# Patient Record
Sex: Female | Born: 1950
Health system: Southern US, Community
[De-identification: ages and names within clinical notes are randomized; demographics above are authoritative.]

## PROBLEM LIST (undated history)

## (undated) DIAGNOSIS — Z9221 Personal history of antineoplastic chemotherapy: Secondary | ICD-10-CM

## (undated) DIAGNOSIS — C50919 Malignant neoplasm of unspecified site of unspecified female breast: Secondary | ICD-10-CM

## (undated) DIAGNOSIS — Z9889 Other specified postprocedural states: Secondary | ICD-10-CM

## (undated) DIAGNOSIS — Z9289 Personal history of other medical treatment: Secondary | ICD-10-CM

## (undated) DIAGNOSIS — Z923 Personal history of irradiation: Secondary | ICD-10-CM

## (undated) DIAGNOSIS — I10 Essential (primary) hypertension: Secondary | ICD-10-CM

## (undated) DIAGNOSIS — E7800 Pure hypercholesterolemia, unspecified: Secondary | ICD-10-CM

## (undated) DIAGNOSIS — C50912 Malignant neoplasm of unspecified site of left female breast: Secondary | ICD-10-CM

## (undated) HISTORY — DX: Essential (primary) hypertension: I10

## (undated) HISTORY — PX: TUBAL LIGATION: SHX77

## (undated) HISTORY — DX: Other specified postprocedural states: Z98.890

## (undated) HISTORY — DX: Pure hypercholesterolemia, unspecified: E78.00

## (undated) HISTORY — DX: Personal history of other medical treatment: Z92.89

## (undated) HISTORY — DX: Malignant neoplasm of unspecified site of unspecified female breast: C50.919

## (undated) HISTORY — PX: PORTACATH PLACEMENT: SHX2246

## (undated) HISTORY — DX: Malignant neoplasm of unspecified site of left female breast: C50.912

---

## 2008-02-22 ENCOUNTER — Ambulatory Visit: Payer: Self-pay | Admitting: Obstetrics and Gynecology

## 2008-03-14 ENCOUNTER — Ambulatory Visit: Payer: Self-pay | Admitting: Gastroenterology

## 2009-03-06 ENCOUNTER — Ambulatory Visit: Payer: Self-pay | Admitting: Obstetrics and Gynecology

## 2010-03-12 ENCOUNTER — Ambulatory Visit: Payer: Self-pay | Admitting: Obstetrics and Gynecology

## 2010-07-21 DIAGNOSIS — Z9889 Other specified postprocedural states: Secondary | ICD-10-CM

## 2010-07-21 HISTORY — DX: Other specified postprocedural states: Z98.890

## 2011-04-07 ENCOUNTER — Ambulatory Visit: Payer: Self-pay | Admitting: Obstetrics and Gynecology

## 2011-04-10 ENCOUNTER — Ambulatory Visit: Payer: Self-pay | Admitting: Obstetrics and Gynecology

## 2011-05-14 ENCOUNTER — Ambulatory Visit: Payer: Self-pay | Admitting: Unknown Physician Specialty

## 2011-05-15 LAB — PATHOLOGY REPORT

## 2011-07-22 DIAGNOSIS — Z9289 Personal history of other medical treatment: Secondary | ICD-10-CM

## 2011-07-22 HISTORY — DX: Personal history of other medical treatment: Z92.89

## 2012-07-21 DIAGNOSIS — C50919 Malignant neoplasm of unspecified site of unspecified female breast: Secondary | ICD-10-CM

## 2012-07-21 HISTORY — DX: Malignant neoplasm of unspecified site of unspecified female breast: C50.919

## 2013-05-13 ENCOUNTER — Ambulatory Visit: Payer: Self-pay | Admitting: Internal Medicine

## 2013-05-26 ENCOUNTER — Ambulatory Visit: Payer: Self-pay | Admitting: Internal Medicine

## 2013-06-01 ENCOUNTER — Ambulatory Visit: Payer: Self-pay | Admitting: Internal Medicine

## 2013-06-01 HISTORY — PX: BREAST BIOPSY: SHX20

## 2013-06-08 LAB — PATHOLOGY REPORT

## 2013-06-14 ENCOUNTER — Ambulatory Visit: Payer: Self-pay | Admitting: Hematology and Oncology

## 2013-06-20 ENCOUNTER — Ambulatory Visit: Payer: Self-pay | Admitting: Surgery

## 2013-06-20 LAB — BASIC METABOLIC PANEL
Anion Gap: 5 — ABNORMAL LOW (ref 7–16)
BUN: 9 mg/dL (ref 7–18)
Calcium, Total: 9.7 mg/dL (ref 8.5–10.1)
Chloride: 92 mmol/L — ABNORMAL LOW (ref 98–107)
Co2: 32 mmol/L (ref 21–32)
Creatinine: 0.61 mg/dL (ref 0.60–1.30)
EGFR (African American): >60
EGFR (Non-African Amer.): >60
Glucose: 95 mg/dL (ref 65–99)
Osmolality: 257 (ref 275–301)
Sodium: 129 mmol/L — ABNORMAL LOW (ref 136–145)

## 2013-06-20 LAB — CBC WITH DIFFERENTIAL/PLATELET
Basophil #: 0.1 10*3/uL (ref 0.0–0.1)
Eosinophil #: 0.1 10*3/uL (ref 0.0–0.7)
Eosinophil %: 1.7 %
HCT: 38.7 % (ref 35.0–47.0)
Lymphocyte #: 1.5 10*3/uL (ref 1.0–3.6)
Lymphocyte %: 22.5 %
MCH: 31.1 pg (ref 26.0–34.0)
MCHC: 34.5 g/dL (ref 32.0–36.0)
MCV: 90 fL (ref 80–100)
Monocyte %: 8.5 %
Neutrophil #: 4.5 10*3/uL (ref 1.4–6.5)
RBC: 4.28 10*6/uL (ref 3.80–5.20)

## 2013-06-21 ENCOUNTER — Ambulatory Visit: Payer: Self-pay | Admitting: Surgery

## 2013-06-21 DIAGNOSIS — C50412 Malignant neoplasm of upper-outer quadrant of left female breast: Secondary | ICD-10-CM | POA: Insufficient documentation

## 2013-06-21 HISTORY — PX: BREAST LUMPECTOMY: SHX2

## 2013-07-01 ENCOUNTER — Ambulatory Visit: Payer: Self-pay

## 2013-07-21 ENCOUNTER — Ambulatory Visit: Payer: Self-pay

## 2013-07-27 ENCOUNTER — Ambulatory Visit: Payer: Self-pay | Admitting: Hematology and Oncology

## 2013-08-12 ENCOUNTER — Ambulatory Visit: Payer: Self-pay | Admitting: Surgery

## 2013-08-18 LAB — COMPREHENSIVE METABOLIC PANEL
Albumin: 4 g/dL (ref 3.4–5.0)
Alkaline Phosphatase: 116 U/L
Anion Gap: 6 — ABNORMAL LOW (ref 7–16)
BUN: 10 mg/dL (ref 7–18)
Bilirubin,Total: 0.3 mg/dL (ref 0.2–1.0)
Calcium, Total: 9.1 mg/dL (ref 8.5–10.1)
Chloride: 93 mmol/L — ABNORMAL LOW (ref 98–107)
Co2: 32 mmol/L (ref 21–32)
Creatinine: 0.71 mg/dL (ref 0.60–1.30)
EGFR (African American): >60
EGFR (Non-African Amer.): >60
Glucose: 104 mg/dL — ABNORMAL HIGH (ref 65–99)
Osmolality: 262 (ref 275–301)
Potassium: 3.6 mmol/L (ref 3.5–5.1)
SGOT(AST): 32 U/L (ref 15–37)
SGPT (ALT): 33 U/L (ref 12–78)
Sodium: 131 mmol/L — ABNORMAL LOW (ref 136–145)
Total Protein: 7.3 g/dL (ref 6.4–8.2)

## 2013-08-18 LAB — CBC CANCER CENTER
Basophil #: 0.1 x10 3/mm (ref 0.0–0.1)
Basophil %: 0.9 %
Eosinophil #: 0.1 x10 3/mm (ref 0.0–0.7)
Eosinophil %: 1.3 %
HCT: 40.5 % (ref 35.0–47.0)
HGB: 13.6 g/dL (ref 12.0–16.0)
Lymphocyte #: 1.4 x10 3/mm (ref 1.0–3.6)
Lymphocyte %: 22.6 %
MCH: 30.4 pg (ref 26.0–34.0)
MCHC: 33.6 g/dL (ref 32.0–36.0)
MCV: 90 fL (ref 80–100)
Monocyte #: 0.5 x10 3/mm (ref 0.2–0.9)
Monocyte %: 8.1 %
Neutrophil #: 4.2 x10 3/mm (ref 1.4–6.5)
Neutrophil %: 67.1 %
Platelet: 309 x10 3/mm (ref 150–440)
RBC: 4.48 10*6/uL (ref 3.80–5.20)
RDW: 12.9 % (ref 11.5–14.5)
WBC: 6.2 x10 3/mm (ref 3.6–11.0)

## 2013-08-21 ENCOUNTER — Ambulatory Visit: Payer: Self-pay | Admitting: Hematology and Oncology

## 2013-08-21 ENCOUNTER — Ambulatory Visit: Payer: Self-pay

## 2013-08-25 LAB — CBC CANCER CENTER
BASOS ABS: 0 x10 3/mm (ref 0.0–0.1)
BASOS PCT: 4.3 %
Eosinophil #: 0 x10 3/mm (ref 0.0–0.7)
Eosinophil %: 3.3 %
HCT: 38 % (ref 35.0–47.0)
HGB: 12.6 g/dL (ref 12.0–16.0)
LYMPHS PCT: 70.9 %
Lymphocyte #: 0.5 x10 3/mm — ABNORMAL LOW (ref 1.0–3.6)
MCH: 30 pg (ref 26.0–34.0)
MCHC: 33.1 g/dL (ref 32.0–36.0)
MCV: 91 fL (ref 80–100)
MONO ABS: 0.1 x10 3/mm — AB (ref 0.2–0.9)
MONOS PCT: 9.8 %
NEUTROS PCT: 11.7 %
Neutrophil #: 0.1 x10 3/mm — ABNORMAL LOW (ref 1.4–6.5)
PLATELETS: 109 x10 3/mm — AB (ref 150–440)
RBC: 4.19 10*6/uL (ref 3.80–5.20)
RDW: 12.3 % (ref 11.5–14.5)
WBC: 0.7 x10 3/mm — CL (ref 3.6–11.0)

## 2013-09-01 LAB — CBC CANCER CENTER
Basophil #: 0.1 x10 3/mm (ref 0.0–0.1)
Basophil %: 0.4 %
Eosinophil #: 0 x10 3/mm (ref 0.0–0.7)
Eosinophil %: 0 %
HCT: 36.7 % (ref 35.0–47.0)
HGB: 12.2 g/dL (ref 12.0–16.0)
LYMPHS ABS: 1.5 x10 3/mm (ref 1.0–3.6)
Lymphocyte %: 9.7 %
MCH: 29.8 pg (ref 26.0–34.0)
MCHC: 33.1 g/dL (ref 32.0–36.0)
MCV: 90 fL (ref 80–100)
MONOS PCT: 6.9 %
Monocyte #: 1.1 x10 3/mm — ABNORMAL HIGH (ref 0.2–0.9)
NEUTROS ABS: 12.6 x10 3/mm — AB (ref 1.4–6.5)
Neutrophil %: 83 %
PLATELETS: 293 x10 3/mm (ref 150–440)
RBC: 4.08 10*6/uL (ref 3.80–5.20)
RDW: 12.6 % (ref 11.5–14.5)
WBC: 15.3 x10 3/mm — AB (ref 3.6–11.0)

## 2013-09-08 LAB — CBC CANCER CENTER
Basophil #: 0.1 x10 3/mm (ref 0.0–0.1)
Basophil %: 1.2 %
EOS PCT: 0.1 %
Eosinophil #: 0 x10 3/mm (ref 0.0–0.7)
HCT: 36.6 % (ref 35.0–47.0)
HGB: 12.2 g/dL (ref 12.0–16.0)
Lymphocyte #: 1.3 x10 3/mm (ref 1.0–3.6)
Lymphocyte %: 14.4 %
MCH: 30.2 pg (ref 26.0–34.0)
MCHC: 33.2 g/dL (ref 32.0–36.0)
MCV: 91 fL (ref 80–100)
Monocyte #: 1.1 x10 3/mm — ABNORMAL HIGH (ref 0.2–0.9)
Monocyte %: 12.9 %
NEUTROS ABS: 6.3 x10 3/mm (ref 1.4–6.5)
Neutrophil %: 71.4 %
Platelet: 507 x10 3/mm — ABNORMAL HIGH (ref 150–440)
RBC: 4.03 10*6/uL (ref 3.80–5.20)
RDW: 12.9 % (ref 11.5–14.5)
WBC: 8.9 x10 3/mm (ref 3.6–11.0)

## 2013-09-08 LAB — BASIC METABOLIC PANEL
ANION GAP: 8 (ref 7–16)
BUN: 10 mg/dL (ref 7–18)
CREATININE: 0.67 mg/dL (ref 0.60–1.30)
Calcium, Total: 8.3 mg/dL — ABNORMAL LOW (ref 8.5–10.1)
Chloride: 96 mmol/L — ABNORMAL LOW (ref 98–107)
Co2: 30 mmol/L (ref 21–32)
EGFR (Non-African Amer.): >60
Glucose: 112 mg/dL — ABNORMAL HIGH (ref 65–99)
Osmolality: 268 (ref 275–301)
Potassium: 3.9 mmol/L (ref 3.5–5.1)
Sodium: 134 mmol/L — ABNORMAL LOW (ref 136–145)

## 2013-09-14 LAB — CBC CANCER CENTER
BASOS ABS: 0 x10 3/mm (ref 0.0–0.1)
BASOS PCT: 0.4 %
EOS PCT: 0.7 %
Eosinophil #: 0 x10 3/mm (ref 0.0–0.7)
HCT: 30.9 % — ABNORMAL LOW (ref 35.0–47.0)
HGB: 10.3 g/dL — AB (ref 12.0–16.0)
LYMPHS ABS: 0.4 x10 3/mm — AB (ref 1.0–3.6)
Lymphocyte %: 20.1 %
MCH: 30.2 pg (ref 26.0–34.0)
MCHC: 33.1 g/dL (ref 32.0–36.0)
MCV: 91 fL (ref 80–100)
MONO ABS: 0 x10 3/mm — AB (ref 0.2–0.9)
MONOS PCT: 1.6 %
NEUTROS PCT: 77.2 %
Neutrophil #: 1.5 x10 3/mm (ref 1.4–6.5)
Platelet: 176 x10 3/mm (ref 150–440)
RBC: 3.39 10*6/uL — AB (ref 3.80–5.20)
RDW: 13.1 % (ref 11.5–14.5)
WBC: 1.9 x10 3/mm — CL (ref 3.6–11.0)

## 2013-09-18 ENCOUNTER — Ambulatory Visit: Payer: Self-pay | Admitting: Hematology and Oncology

## 2013-09-18 ENCOUNTER — Ambulatory Visit: Payer: Self-pay

## 2013-09-22 LAB — CBC CANCER CENTER
BASOS PCT: 0.3 %
Basophil #: 0 x10 3/mm (ref 0.0–0.1)
EOS PCT: 0.2 %
Eosinophil #: 0 x10 3/mm (ref 0.0–0.7)
HCT: 31.6 % — ABNORMAL LOW (ref 35.0–47.0)
HGB: 10.5 g/dL — ABNORMAL LOW (ref 12.0–16.0)
LYMPHS ABS: 1.2 x10 3/mm (ref 1.0–3.6)
Lymphocyte %: 6.9 %
MCH: 29.9 pg (ref 26.0–34.0)
MCHC: 33.3 g/dL (ref 32.0–36.0)
MCV: 90 fL (ref 80–100)
Monocyte #: 1.3 x10 3/mm — ABNORMAL HIGH (ref 0.2–0.9)
Monocyte %: 7.7 %
NEUTROS ABS: 14.2 x10 3/mm — AB (ref 1.4–6.5)
NEUTROS PCT: 84.9 %
Platelet: 246 x10 3/mm (ref 150–440)
RBC: 3.52 10*6/uL — AB (ref 3.80–5.20)
RDW: 12.9 % (ref 11.5–14.5)
WBC: 16.7 x10 3/mm — AB (ref 3.6–11.0)

## 2013-09-29 LAB — BASIC METABOLIC PANEL
Anion Gap: 7 (ref 7–16)
BUN: 10 mg/dL (ref 7–18)
CO2: 31 mmol/L (ref 21–32)
CREATININE: 0.65 mg/dL (ref 0.60–1.30)
Calcium, Total: 9.1 mg/dL (ref 8.5–10.1)
Chloride: 97 mmol/L — ABNORMAL LOW (ref 98–107)
EGFR (African American): >60
EGFR (Non-African Amer.): >60
Glucose: 83 mg/dL (ref 65–99)
OSMOLALITY: 268 (ref 275–301)
Potassium: 4.1 mmol/L (ref 3.5–5.1)
Sodium: 135 mmol/L — ABNORMAL LOW (ref 136–145)

## 2013-09-29 LAB — CBC CANCER CENTER
Basophil #: 0.2 x10 3/mm — ABNORMAL HIGH (ref 0.0–0.1)
Basophil %: 2.6 %
EOS ABS: 0 x10 3/mm (ref 0.0–0.7)
EOS PCT: 0.1 %
HCT: 33.2 % — ABNORMAL LOW (ref 35.0–47.0)
HGB: 11.2 g/dL — ABNORMAL LOW (ref 12.0–16.0)
Lymphocyte #: 0.7 x10 3/mm — ABNORMAL LOW (ref 1.0–3.6)
Lymphocyte %: 10.6 %
MCH: 30.6 pg (ref 26.0–34.0)
MCHC: 33.7 g/dL (ref 32.0–36.0)
MCV: 91 fL (ref 80–100)
MONO ABS: 1 x10 3/mm — AB (ref 0.2–0.9)
MONOS PCT: 13.7 %
NEUTROS PCT: 73 %
Neutrophil #: 5.2 x10 3/mm (ref 1.4–6.5)
Platelet: 450 x10 3/mm — ABNORMAL HIGH (ref 150–440)
RBC: 3.66 10*6/uL — ABNORMAL LOW (ref 3.80–5.20)
RDW: 13.8 % (ref 11.5–14.5)
WBC: 7.1 x10 3/mm (ref 3.6–11.0)

## 2013-10-06 LAB — CBC CANCER CENTER
Basophil #: 0 x10 3/mm (ref 0.0–0.1)
Basophil %: 0.4 %
Eosinophil #: 0 x10 3/mm (ref 0.0–0.7)
Eosinophil %: 0.9 %
HCT: 27.7 % — ABNORMAL LOW (ref 35.0–47.0)
HGB: 9.3 g/dL — ABNORMAL LOW (ref 12.0–16.0)
Lymphocyte #: 0.4 x10 3/mm — ABNORMAL LOW (ref 1.0–3.6)
Lymphocyte %: 32.5 %
MCH: 30.6 pg (ref 26.0–34.0)
MCHC: 33.5 g/dL (ref 32.0–36.0)
MCV: 91 fL (ref 80–100)
Monocyte #: 0.1 x10 3/mm — ABNORMAL LOW (ref 0.2–0.9)
Monocyte %: 5.7 %
NEUTROS PCT: 60.5 %
Neutrophil #: 0.8 x10 3/mm — ABNORMAL LOW (ref 1.4–6.5)
Platelet: 112 x10 3/mm — ABNORMAL LOW (ref 150–440)
RBC: 3.04 10*6/uL — ABNORMAL LOW (ref 3.80–5.20)
RDW: 14.4 % (ref 11.5–14.5)
WBC: 1.3 x10 3/mm — AB (ref 3.6–11.0)

## 2013-10-13 LAB — CBC CANCER CENTER
BASOS ABS: 0.1 x10 3/mm (ref 0.0–0.1)
Basophil %: 0.4 %
EOS ABS: 0 x10 3/mm (ref 0.0–0.7)
EOS PCT: 0.1 %
HCT: 31.9 % — ABNORMAL LOW (ref 35.0–47.0)
HGB: 10.5 g/dL — ABNORMAL LOW (ref 12.0–16.0)
LYMPHS ABS: 1.1 x10 3/mm (ref 1.0–3.6)
Lymphocyte %: 7.9 %
MCH: 30.3 pg (ref 26.0–34.0)
MCHC: 33 g/dL (ref 32.0–36.0)
MCV: 92 fL (ref 80–100)
MONO ABS: 1.3 x10 3/mm — AB (ref 0.2–0.9)
Monocyte %: 9 %
Neutrophil #: 11.8 x10 3/mm — ABNORMAL HIGH (ref 1.4–6.5)
Neutrophil %: 82.6 %
Platelet: 290 x10 3/mm (ref 150–440)
RBC: 3.48 10*6/uL — AB (ref 3.80–5.20)
RDW: 14.8 % — AB (ref 11.5–14.5)
WBC: 14.2 x10 3/mm — ABNORMAL HIGH (ref 3.6–11.0)

## 2013-10-19 ENCOUNTER — Ambulatory Visit: Payer: Self-pay

## 2013-10-19 ENCOUNTER — Ambulatory Visit: Payer: Self-pay | Admitting: Hematology and Oncology

## 2013-10-20 LAB — BASIC METABOLIC PANEL
Anion Gap: 7 (ref 7–16)
BUN: 9 mg/dL (ref 7–18)
CO2: 30 mmol/L (ref 21–32)
Calcium, Total: 9 mg/dL (ref 8.5–10.1)
Chloride: 97 mmol/L — ABNORMAL LOW (ref 98–107)
Creatinine: 0.61 mg/dL (ref 0.60–1.30)
EGFR (African American): >60
EGFR (Non-African Amer.): >60
Glucose: 95 mg/dL (ref 65–99)
Osmolality: 267 (ref 275–301)
Potassium: 3.9 mmol/L (ref 3.5–5.1)
SODIUM: 134 mmol/L — AB (ref 136–145)

## 2013-10-20 LAB — CBC CANCER CENTER
Basophil #: 0.1 x10 3/mm (ref 0.0–0.1)
Basophil %: 1.2 %
Eosinophil #: 0 x10 3/mm (ref 0.0–0.7)
Eosinophil %: 0.1 %
HCT: 32.6 % — ABNORMAL LOW (ref 35.0–47.0)
HGB: 11.1 g/dL — ABNORMAL LOW (ref 12.0–16.0)
LYMPHS PCT: 10.4 %
Lymphocyte #: 0.7 x10 3/mm — ABNORMAL LOW (ref 1.0–3.6)
MCH: 31.4 pg (ref 26.0–34.0)
MCHC: 33.9 g/dL (ref 32.0–36.0)
MCV: 93 fL (ref 80–100)
Monocyte #: 1.2 x10 3/mm — ABNORMAL HIGH (ref 0.2–0.9)
Monocyte %: 17.4 %
Neutrophil #: 4.8 x10 3/mm (ref 1.4–6.5)
Neutrophil %: 70.9 %
PLATELETS: 410 x10 3/mm (ref 150–440)
RBC: 3.52 10*6/uL — AB (ref 3.80–5.20)
RDW: 17.1 % — AB (ref 11.5–14.5)
WBC: 6.8 x10 3/mm (ref 3.6–11.0)

## 2013-10-27 LAB — CBC CANCER CENTER
BASOS ABS: 0 x10 3/mm (ref 0.0–0.1)
Basophil %: 3.3 %
EOS PCT: 0.9 %
Eosinophil #: 0 x10 3/mm (ref 0.0–0.7)
HCT: 27.5 % — AB (ref 35.0–47.0)
HGB: 9.3 g/dL — AB (ref 12.0–16.0)
LYMPHS ABS: 0.4 x10 3/mm — AB (ref 1.0–3.6)
LYMPHS PCT: 42.7 %
MCH: 31.6 pg (ref 26.0–34.0)
MCHC: 33.7 g/dL (ref 32.0–36.0)
MCV: 94 fL (ref 80–100)
MONO ABS: 0.1 x10 3/mm — AB (ref 0.2–0.9)
Monocyte %: 6.8 %
Neutrophil #: 0.4 x10 3/mm — ABNORMAL LOW (ref 1.4–6.5)
Neutrophil %: 46.3 %
Platelet: 85 x10 3/mm — ABNORMAL LOW (ref 150–440)
RBC: 2.94 10*6/uL — ABNORMAL LOW (ref 3.80–5.20)
RDW: 16.3 % — AB (ref 11.5–14.5)
WBC: 0.8 x10 3/mm — CL (ref 3.6–11.0)

## 2013-11-03 LAB — CBC CANCER CENTER
BASOS ABS: 0 x10 3/mm (ref 0.0–0.1)
Basophil %: 0.2 %
EOS ABS: 0 x10 3/mm (ref 0.0–0.7)
Eosinophil %: 0 %
HCT: 31.3 % — AB (ref 35.0–47.0)
HGB: 10.3 g/dL — ABNORMAL LOW (ref 12.0–16.0)
Lymphocyte #: 0.9 x10 3/mm — ABNORMAL LOW (ref 1.0–3.6)
Lymphocyte %: 6 %
MCH: 30.8 pg (ref 26.0–34.0)
MCHC: 32.7 g/dL (ref 32.0–36.0)
MCV: 94 fL (ref 80–100)
Monocyte #: 1.4 x10 3/mm — ABNORMAL HIGH (ref 0.2–0.9)
Monocyte %: 10 %
Neutrophil #: 11.9 x10 3/mm — ABNORMAL HIGH (ref 1.4–6.5)
Neutrophil %: 83.8 %
PLATELETS: 255 x10 3/mm (ref 150–440)
RBC: 3.33 10*6/uL — AB (ref 3.80–5.20)
RDW: 16.2 % — ABNORMAL HIGH (ref 11.5–14.5)
WBC: 14.3 x10 3/mm — ABNORMAL HIGH (ref 3.6–11.0)

## 2013-11-10 LAB — CBC CANCER CENTER
BASOS ABS: 0.1 x10 3/mm (ref 0.0–0.1)
Basophil %: 1.2 %
Eosinophil #: 0 x10 3/mm (ref 0.0–0.7)
Eosinophil %: 0.1 %
HCT: 31.5 % — AB (ref 35.0–47.0)
HGB: 10.6 g/dL — AB (ref 12.0–16.0)
Lymphocyte #: 0.6 x10 3/mm — ABNORMAL LOW (ref 1.0–3.6)
Lymphocyte %: 9.4 %
MCH: 31.9 pg (ref 26.0–34.0)
MCHC: 33.8 g/dL (ref 32.0–36.0)
MCV: 95 fL (ref 80–100)
MONO ABS: 1.1 x10 3/mm — AB (ref 0.2–0.9)
Monocyte %: 18.1 %
Neutrophil #: 4.4 x10 3/mm (ref 1.4–6.5)
Neutrophil %: 71.2 %
Platelet: 460 x10 3/mm — ABNORMAL HIGH (ref 150–440)
RBC: 3.33 10*6/uL — ABNORMAL LOW (ref 3.80–5.20)
RDW: 17.9 % — AB (ref 11.5–14.5)
WBC: 6.2 x10 3/mm (ref 3.6–11.0)

## 2013-11-10 LAB — BASIC METABOLIC PANEL
ANION GAP: 9 (ref 7–16)
BUN: 10 mg/dL (ref 7–18)
CALCIUM: 9.2 mg/dL (ref 8.5–10.1)
Chloride: 97 mmol/L — ABNORMAL LOW (ref 98–107)
Co2: 29 mmol/L (ref 21–32)
Creatinine: 0.69 mg/dL (ref 0.60–1.30)
EGFR (African American): >60
EGFR (Non-African Amer.): >60
GLUCOSE: 99 mg/dL (ref 65–99)
Osmolality: 269 (ref 275–301)
POTASSIUM: 3.9 mmol/L (ref 3.5–5.1)
Sodium: 135 mmol/L — ABNORMAL LOW (ref 136–145)

## 2013-11-17 LAB — CBC CANCER CENTER
Basophil #: 0.1 x10 3/mm (ref 0.0–0.1)
Basophil %: 1.5 %
EOS ABS: 0 x10 3/mm (ref 0.0–0.7)
Eosinophil %: 0.3 %
HCT: 29.4 % — ABNORMAL LOW (ref 35.0–47.0)
HGB: 10.3 g/dL — AB (ref 12.0–16.0)
LYMPHS ABS: 0.7 x10 3/mm — AB (ref 1.0–3.6)
Lymphocyte %: 13.8 %
MCH: 32.7 pg (ref 26.0–34.0)
MCHC: 35.2 g/dL (ref 32.0–36.0)
MCV: 93 fL (ref 80–100)
MONO ABS: 0.5 x10 3/mm (ref 0.2–0.9)
Monocyte %: 10.8 %
Neutrophil #: 3.6 x10 3/mm (ref 1.4–6.5)
Neutrophil %: 73.6 %
Platelet: 264 x10 3/mm (ref 150–440)
RBC: 3.17 10*6/uL — AB (ref 3.80–5.20)
RDW: 17.4 % — AB (ref 11.5–14.5)
WBC: 4.9 x10 3/mm (ref 3.6–11.0)

## 2013-11-18 ENCOUNTER — Ambulatory Visit: Payer: Self-pay | Admitting: Hematology and Oncology

## 2013-11-24 LAB — COMPREHENSIVE METABOLIC PANEL
Albumin: 3.8 g/dL (ref 3.4–5.0)
Alkaline Phosphatase: 126 U/L — ABNORMAL HIGH
Anion Gap: 8 (ref 7–16)
BUN: 8 mg/dL (ref 7–18)
Bilirubin,Total: 0.5 mg/dL (ref 0.2–1.0)
Calcium, Total: 9.1 mg/dL (ref 8.5–10.1)
Chloride: 95 mmol/L — ABNORMAL LOW (ref 98–107)
Co2: 31 mmol/L (ref 21–32)
Creatinine: 0.56 mg/dL — ABNORMAL LOW (ref 0.60–1.30)
EGFR (African American): >60
EGFR (Non-African Amer.): >60
Glucose: 110 mg/dL — ABNORMAL HIGH (ref 65–99)
Osmolality: 267 (ref 275–301)
Potassium: 3.7 mmol/L (ref 3.5–5.1)
SGOT(AST): 31 U/L (ref 15–37)
SGPT (ALT): 50 U/L (ref 12–78)
Sodium: 134 mmol/L — ABNORMAL LOW (ref 136–145)
Total Protein: 6.7 g/dL (ref 6.4–8.2)

## 2013-11-24 LAB — CBC CANCER CENTER
Basophil #: 0 x10 3/mm (ref 0.0–0.1)
Basophil %: 0.4 %
Eosinophil #: 0.1 x10 3/mm (ref 0.0–0.7)
Eosinophil %: 1.7 %
HCT: 29.3 % — ABNORMAL LOW (ref 35.0–47.0)
HGB: 10.1 g/dL — ABNORMAL LOW (ref 12.0–16.0)
Lymphocyte #: 0.6 x10 3/mm — ABNORMAL LOW (ref 1.0–3.6)
Lymphocyte %: 13.4 %
MCH: 33.1 pg (ref 26.0–34.0)
MCHC: 34.4 g/dL (ref 32.0–36.0)
MCV: 96 fL (ref 80–100)
Monocyte #: 0.4 x10 3/mm (ref 0.2–0.9)
Monocyte %: 9.3 %
Neutrophil #: 3.3 x10 3/mm (ref 1.4–6.5)
Neutrophil %: 75.2 %
Platelet: 230 x10 3/mm (ref 150–440)
RBC: 3.05 10*6/uL — ABNORMAL LOW (ref 3.80–5.20)
RDW: 17.5 % — ABNORMAL HIGH (ref 11.5–14.5)
WBC: 4.3 x10 3/mm (ref 3.6–11.0)

## 2013-12-01 LAB — CBC CANCER CENTER
Basophil #: 0.1 x10 3/mm (ref 0.0–0.1)
Basophil %: 1.7 %
Eosinophil #: 0.1 x10 3/mm (ref 0.0–0.7)
Eosinophil %: 2.7 %
HCT: 28.8 % — ABNORMAL LOW (ref 35.0–47.0)
HGB: 10.1 g/dL — ABNORMAL LOW (ref 12.0–16.0)
Lymphocyte #: 0.6 x10 3/mm — ABNORMAL LOW (ref 1.0–3.6)
Lymphocyte %: 13.5 %
MCH: 33.5 pg (ref 26.0–34.0)
MCHC: 35.2 g/dL (ref 32.0–36.0)
MCV: 95 fL (ref 80–100)
Monocyte #: 0.4 x10 3/mm (ref 0.2–0.9)
Monocyte %: 9.5 %
Neutrophil #: 3 x10 3/mm (ref 1.4–6.5)
Neutrophil %: 72.6 %
Platelet: 265 x10 3/mm (ref 150–440)
RBC: 3.02 10*6/uL — ABNORMAL LOW (ref 3.80–5.20)
RDW: 17 % — ABNORMAL HIGH (ref 11.5–14.5)
WBC: 4.2 x10 3/mm (ref 3.6–11.0)

## 2013-12-08 LAB — BASIC METABOLIC PANEL
Anion Gap: 9 (ref 7–16)
BUN: 7 mg/dL (ref 7–18)
Calcium, Total: 8.3 mg/dL — ABNORMAL LOW (ref 8.5–10.1)
Chloride: 97 mmol/L — ABNORMAL LOW (ref 98–107)
Co2: 29 mmol/L (ref 21–32)
Creatinine: 0.55 mg/dL — ABNORMAL LOW (ref 0.60–1.30)
EGFR (African American): >60
EGFR (Non-African Amer.): >60
Glucose: 105 mg/dL — ABNORMAL HIGH (ref 65–99)
Osmolality: 268 (ref 275–301)
Potassium: 3.7 mmol/L (ref 3.5–5.1)
Sodium: 135 mmol/L — ABNORMAL LOW (ref 136–145)

## 2013-12-08 LAB — CBC CANCER CENTER
Basophil #: 0.1 x10 3/mm (ref 0.0–0.1)
Basophil %: 2.3 %
Eosinophil #: 0 x10 3/mm (ref 0.0–0.7)
Eosinophil %: 1.3 %
HCT: 30.5 % — ABNORMAL LOW (ref 35.0–47.0)
HGB: 10.7 g/dL — ABNORMAL LOW (ref 12.0–16.0)
Lymphocyte #: 0.5 x10 3/mm — ABNORMAL LOW (ref 1.0–3.6)
Lymphocyte %: 15.1 %
MCH: 33.5 pg (ref 26.0–34.0)
MCHC: 34.9 g/dL (ref 32.0–36.0)
MCV: 96 fL (ref 80–100)
Monocyte #: 0.3 x10 3/mm (ref 0.2–0.9)
Monocyte %: 8 %
Neutrophil #: 2.4 x10 3/mm (ref 1.4–6.5)
Neutrophil %: 73.3 %
Platelet: 246 x10 3/mm (ref 150–440)
RBC: 3.18 10*6/uL — ABNORMAL LOW (ref 3.80–5.20)
RDW: 16 % — ABNORMAL HIGH (ref 11.5–14.5)
WBC: 3.3 x10 3/mm — ABNORMAL LOW (ref 3.6–11.0)

## 2013-12-15 LAB — URINALYSIS, COMPLETE
Bilirubin,UR: NEGATIVE
Glucose,UR: NEGATIVE mg/dL (ref 0–75)
Ketone: NEGATIVE
Nitrite: NEGATIVE
Ph: 6 (ref 4.5–8.0)
Protein: NEGATIVE
RBC,UR: 1 /HPF (ref 0–5)
Specific Gravity: 1.003 (ref 1.003–1.030)
Squamous Epithelial: 2
WBC UR: 19 /HPF (ref 0–5)

## 2013-12-15 LAB — CBC CANCER CENTER
Basophil #: 0 x10 3/mm (ref 0.0–0.1)
Basophil %: 0.9 %
Eosinophil #: 0 x10 3/mm (ref 0.0–0.7)
Eosinophil %: 0.9 %
HCT: 31.4 % — ABNORMAL LOW (ref 35.0–47.0)
HGB: 10.9 g/dL — ABNORMAL LOW (ref 12.0–16.0)
Lymphocyte #: 0.5 x10 3/mm — ABNORMAL LOW (ref 1.0–3.6)
Lymphocyte %: 10.8 %
MCH: 34.2 pg — ABNORMAL HIGH (ref 26.0–34.0)
MCHC: 34.5 g/dL (ref 32.0–36.0)
MCV: 99 fL (ref 80–100)
Monocyte #: 0.5 x10 3/mm (ref 0.2–0.9)
Monocyte %: 9.4 %
Neutrophil #: 3.8 x10 3/mm (ref 1.4–6.5)
Neutrophil %: 78 %
Platelet: 256 x10 3/mm (ref 150–440)
RBC: 3.18 10*6/uL — ABNORMAL LOW (ref 3.80–5.20)
RDW: 14.9 % — ABNORMAL HIGH (ref 11.5–14.5)
WBC: 4.9 x10 3/mm (ref 3.6–11.0)

## 2013-12-16 LAB — URINE CULTURE

## 2013-12-19 ENCOUNTER — Ambulatory Visit: Payer: Self-pay | Admitting: Hematology and Oncology

## 2013-12-22 LAB — CBC CANCER CENTER
Basophil #: 0.1 x10 3/mm (ref 0.0–0.1)
Basophil %: 1 %
Eosinophil #: 0 x10 3/mm (ref 0.0–0.7)
Eosinophil %: 0.8 %
HCT: 32.3 % — ABNORMAL LOW (ref 35.0–47.0)
HGB: 11.1 g/dL — ABNORMAL LOW (ref 12.0–16.0)
Lymphocyte #: 0.8 x10 3/mm — ABNORMAL LOW (ref 1.0–3.6)
Lymphocyte %: 15 %
MCH: 34 pg (ref 26.0–34.0)
MCHC: 34.5 g/dL (ref 32.0–36.0)
MCV: 99 fL (ref 80–100)
Monocyte #: 0.5 x10 3/mm (ref 0.2–0.9)
Monocyte %: 9.7 %
Neutrophil #: 4 x10 3/mm (ref 1.4–6.5)
Neutrophil %: 73.5 %
Platelet: 281 x10 3/mm (ref 150–440)
RBC: 3.27 10*6/uL — ABNORMAL LOW (ref 3.80–5.20)
RDW: 14.9 % — ABNORMAL HIGH (ref 11.5–14.5)
WBC: 5.4 x10 3/mm (ref 3.6–11.0)

## 2013-12-29 LAB — CBC CANCER CENTER
Basophil #: 0 x10 3/mm (ref 0.0–0.1)
Basophil %: 1.2 %
Eosinophil #: 0 x10 3/mm (ref 0.0–0.7)
Eosinophil %: 1.2 %
HCT: 32.2 % — ABNORMAL LOW (ref 35.0–47.0)
HGB: 11.2 g/dL — ABNORMAL LOW (ref 12.0–16.0)
Lymphocyte #: 0.6 x10 3/mm — ABNORMAL LOW (ref 1.0–3.6)
Lymphocyte %: 16.8 %
MCH: 33.7 pg (ref 26.0–34.0)
MCHC: 34.7 g/dL (ref 32.0–36.0)
MCV: 97 fL (ref 80–100)
Monocyte #: 0.4 x10 3/mm (ref 0.2–0.9)
Monocyte %: 10.4 %
Neutrophil #: 2.4 x10 3/mm (ref 1.4–6.5)
Neutrophil %: 70.4 %
Platelet: 264 x10 3/mm (ref 150–440)
RBC: 3.32 10*6/uL — ABNORMAL LOW (ref 3.80–5.20)
RDW: 14.5 % (ref 11.5–14.5)
WBC: 3.4 x10 3/mm — ABNORMAL LOW (ref 3.6–11.0)

## 2013-12-29 LAB — BASIC METABOLIC PANEL
Anion Gap: 7 (ref 7–16)
BUN: 12 mg/dL (ref 7–18)
Calcium, Total: 9.4 mg/dL (ref 8.5–10.1)
Chloride: 94 mmol/L — ABNORMAL LOW (ref 98–107)
Co2: 31 mmol/L (ref 21–32)
Creatinine: 0.58 mg/dL — ABNORMAL LOW (ref 0.60–1.30)
EGFR (African American): >60
EGFR (Non-African Amer.): >60
Glucose: 111 mg/dL — ABNORMAL HIGH (ref 65–99)
Osmolality: 265 (ref 275–301)
Potassium: 3.6 mmol/L (ref 3.5–5.1)
Sodium: 132 mmol/L — ABNORMAL LOW (ref 136–145)

## 2014-01-05 LAB — CBC CANCER CENTER
Basophil #: 0 x10 3/mm (ref 0.0–0.1)
Basophil %: 0.8 %
Eosinophil #: 0 x10 3/mm (ref 0.0–0.7)
Eosinophil %: 1 %
HCT: 33.5 % — ABNORMAL LOW (ref 35.0–47.0)
HGB: 11.5 g/dL — ABNORMAL LOW (ref 12.0–16.0)
Lymphocyte #: 0.8 x10 3/mm — ABNORMAL LOW (ref 1.0–3.6)
Lymphocyte %: 21.1 %
MCH: 33.2 pg (ref 26.0–34.0)
MCHC: 34.4 g/dL (ref 32.0–36.0)
MCV: 97 fL (ref 80–100)
Monocyte #: 0.4 x10 3/mm (ref 0.2–0.9)
Monocyte %: 10.9 %
Neutrophil #: 2.4 x10 3/mm (ref 1.4–6.5)
Neutrophil %: 66.2 %
Platelet: 290 x10 3/mm (ref 150–440)
RBC: 3.46 10*6/uL — ABNORMAL LOW (ref 3.80–5.20)
RDW: 14.4 % (ref 11.5–14.5)
WBC: 3.6 x10 3/mm (ref 3.6–11.0)

## 2014-01-12 LAB — CBC CANCER CENTER
Basophil #: 0 x10 3/mm (ref 0.0–0.1)
Basophil %: 0.1 %
Eosinophil #: 0 x10 3/mm (ref 0.0–0.7)
Eosinophil %: 0 %
HCT: 33.9 % — ABNORMAL LOW (ref 35.0–47.0)
HGB: 11.7 g/dL — ABNORMAL LOW (ref 12.0–16.0)
Lymphocyte #: 0.4 x10 3/mm — ABNORMAL LOW (ref 1.0–3.6)
Lymphocyte %: 4.9 %
MCH: 33 pg (ref 26.0–34.0)
MCHC: 34.4 g/dL (ref 32.0–36.0)
MCV: 96 fL (ref 80–100)
Monocyte #: 0.3 x10 3/mm (ref 0.2–0.9)
Monocyte %: 3.7 %
Neutrophil #: 6.9 x10 3/mm — ABNORMAL HIGH (ref 1.4–6.5)
Neutrophil %: 91.3 %
Platelet: 295 x10 3/mm (ref 150–440)
RBC: 3.54 10*6/uL — ABNORMAL LOW (ref 3.80–5.20)
RDW: 14.4 % (ref 11.5–14.5)
WBC: 7.5 x10 3/mm (ref 3.6–11.0)

## 2014-01-18 ENCOUNTER — Ambulatory Visit: Payer: Self-pay | Admitting: Hematology and Oncology

## 2014-01-18 ENCOUNTER — Ambulatory Visit: Payer: Self-pay

## 2014-01-19 LAB — CBC CANCER CENTER
Basophil #: 0 x10 3/mm (ref 0.0–0.1)
Basophil %: 0.7 %
Eosinophil #: 0 x10 3/mm (ref 0.0–0.7)
Eosinophil %: 0.8 %
HCT: 33.9 % — ABNORMAL LOW (ref 35.0–47.0)
HGB: 11.6 g/dL — ABNORMAL LOW (ref 12.0–16.0)
Lymphocyte #: 1 x10 3/mm (ref 1.0–3.6)
Lymphocyte %: 20.9 %
MCH: 33 pg (ref 26.0–34.0)
MCHC: 34.3 g/dL (ref 32.0–36.0)
MCV: 96 fL (ref 80–100)
Monocyte #: 0.5 x10 3/mm (ref 0.2–0.9)
Monocyte %: 12 %
Neutrophil #: 3 x10 3/mm (ref 1.4–6.5)
Neutrophil %: 65.6 %
Platelet: 278 x10 3/mm (ref 150–440)
RBC: 3.52 10*6/uL — ABNORMAL LOW (ref 3.80–5.20)
RDW: 14.4 % (ref 11.5–14.5)
WBC: 4.6 x10 3/mm (ref 3.6–11.0)

## 2014-01-19 LAB — BASIC METABOLIC PANEL
Anion Gap: 6 — ABNORMAL LOW (ref 7–16)
BUN: 11 mg/dL (ref 7–18)
Calcium, Total: 8.8 mg/dL (ref 8.5–10.1)
Chloride: 95 mmol/L — ABNORMAL LOW (ref 98–107)
Co2: 32 mmol/L (ref 21–32)
Creatinine: 0.54 mg/dL — ABNORMAL LOW (ref 0.60–1.30)
EGFR (African American): >60
EGFR (Non-African Amer.): >60
Glucose: 95 mg/dL (ref 65–99)
Osmolality: 266 (ref 275–301)
Potassium: 3.6 mmol/L (ref 3.5–5.1)
Sodium: 133 mmol/L — ABNORMAL LOW (ref 136–145)

## 2014-01-26 LAB — CBC CANCER CENTER
Basophil #: 0 x10 3/mm (ref 0.0–0.1)
Basophil %: 1 %
Eosinophil #: 0 x10 3/mm (ref 0.0–0.7)
Eosinophil %: 1.1 %
HCT: 32.6 % — ABNORMAL LOW (ref 35.0–47.0)
HGB: 11.1 g/dL — ABNORMAL LOW (ref 12.0–16.0)
Lymphocyte #: 0.7 x10 3/mm — ABNORMAL LOW (ref 1.0–3.6)
Lymphocyte %: 21.2 %
MCH: 33 pg (ref 26.0–34.0)
MCHC: 34 g/dL (ref 32.0–36.0)
MCV: 97 fL (ref 80–100)
Monocyte #: 0.4 x10 3/mm (ref 0.2–0.9)
Monocyte %: 12.5 %
Neutrophil #: 2.2 x10 3/mm (ref 1.4–6.5)
Neutrophil %: 64.2 %
Platelet: 263 x10 3/mm (ref 150–440)
RBC: 3.36 10*6/uL — ABNORMAL LOW (ref 3.80–5.20)
RDW: 14.4 % (ref 11.5–14.5)
WBC: 3.5 x10 3/mm — ABNORMAL LOW (ref 3.6–11.0)

## 2014-02-18 ENCOUNTER — Ambulatory Visit: Payer: Self-pay | Admitting: Oncology

## 2014-02-18 ENCOUNTER — Ambulatory Visit: Payer: Self-pay | Admitting: Hematology and Oncology

## 2014-02-23 LAB — CBC CANCER CENTER
Basophil #: 0.1 x10 3/mm (ref 0.0–0.1)
Basophil %: 1.2 %
EOS PCT: 1.8 %
Eosinophil #: 0.1 x10 3/mm (ref 0.0–0.7)
HCT: 35.7 % (ref 35.0–47.0)
HGB: 12 g/dL (ref 12.0–16.0)
LYMPHS PCT: 17.4 %
Lymphocyte #: 1 x10 3/mm (ref 1.0–3.6)
MCH: 32.2 pg (ref 26.0–34.0)
MCHC: 33.5 g/dL (ref 32.0–36.0)
MCV: 96 fL (ref 80–100)
MONO ABS: 0.6 x10 3/mm (ref 0.2–0.9)
MONOS PCT: 9.9 %
Neutrophil #: 4.2 x10 3/mm (ref 1.4–6.5)
Neutrophil %: 69.7 %
Platelet: 272 x10 3/mm (ref 150–440)
RBC: 3.72 10*6/uL — ABNORMAL LOW (ref 3.80–5.20)
RDW: 14.1 % (ref 11.5–14.5)
WBC: 6 x10 3/mm (ref 3.6–11.0)

## 2014-02-23 LAB — COMPREHENSIVE METABOLIC PANEL
ALBUMIN: 3.7 g/dL (ref 3.4–5.0)
ALK PHOS: 105 U/L
ALT: 43 U/L
ANION GAP: 4 — AB (ref 7–16)
BUN: 11 mg/dL (ref 7–18)
Bilirubin,Total: 0.4 mg/dL (ref 0.2–1.0)
CALCIUM: 9.3 mg/dL (ref 8.5–10.1)
Chloride: 94 mmol/L — ABNORMAL LOW (ref 98–107)
Co2: 33 mmol/L — ABNORMAL HIGH (ref 21–32)
Creatinine: 0.69 mg/dL (ref 0.60–1.30)
EGFR (African American): >60
GLUCOSE: 123 mg/dL — AB (ref 65–99)
OSMOLALITY: 263 (ref 275–301)
POTASSIUM: 3.6 mmol/L (ref 3.5–5.1)
SGOT(AST): 34 U/L (ref 15–37)
SODIUM: 131 mmol/L — AB (ref 136–145)
Total Protein: 6.5 g/dL (ref 6.4–8.2)

## 2014-03-13 LAB — CBC CANCER CENTER
Basophil #: 0.1 x10 3/mm (ref 0.0–0.1)
Basophil %: 1 %
Eosinophil #: 0.1 x10 3/mm (ref 0.0–0.7)
Eosinophil %: 1.2 %
HCT: 37.2 % (ref 35.0–47.0)
HGB: 12.3 g/dL (ref 12.0–16.0)
LYMPHS ABS: 0.7 x10 3/mm — AB (ref 1.0–3.6)
Lymphocyte %: 13.3 %
MCH: 31.5 pg (ref 26.0–34.0)
MCHC: 33.2 g/dL (ref 32.0–36.0)
MCV: 95 fL (ref 80–100)
MONO ABS: 0.6 x10 3/mm (ref 0.2–0.9)
Monocyte %: 11.2 %
Neutrophil #: 3.9 x10 3/mm (ref 1.4–6.5)
Neutrophil %: 73.3 %
PLATELETS: 266 x10 3/mm (ref 150–440)
RBC: 3.92 10*6/uL (ref 3.80–5.20)
RDW: 13.7 % (ref 11.5–14.5)
WBC: 5.3 x10 3/mm (ref 3.6–11.0)

## 2014-03-20 LAB — CBC CANCER CENTER
BASOS ABS: 0.1 x10 3/mm (ref 0.0–0.1)
Basophil %: 1.2 %
Eosinophil #: 0.1 x10 3/mm (ref 0.0–0.7)
Eosinophil %: 1.5 %
HCT: 36.5 % (ref 35.0–47.0)
HGB: 12.1 g/dL (ref 12.0–16.0)
Lymphocyte #: 0.7 x10 3/mm — ABNORMAL LOW (ref 1.0–3.6)
Lymphocyte %: 14.4 %
MCH: 31.5 pg (ref 26.0–34.0)
MCHC: 33.3 g/dL (ref 32.0–36.0)
MCV: 95 fL (ref 80–100)
Monocyte #: 0.6 x10 3/mm (ref 0.2–0.9)
Monocyte %: 12.6 %
Neutrophil #: 3.3 x10 3/mm (ref 1.4–6.5)
Neutrophil %: 70.3 %
PLATELETS: 242 x10 3/mm (ref 150–440)
RBC: 3.85 10*6/uL (ref 3.80–5.20)
RDW: 13.7 % (ref 11.5–14.5)
WBC: 4.6 x10 3/mm (ref 3.6–11.0)

## 2014-03-21 ENCOUNTER — Ambulatory Visit: Payer: Self-pay | Admitting: Hematology and Oncology

## 2014-03-21 ENCOUNTER — Ambulatory Visit: Payer: Self-pay | Admitting: Oncology

## 2014-04-03 LAB — CBC CANCER CENTER
Basophil #: 0 x10 3/mm (ref 0.0–0.1)
Basophil %: 0.6 %
EOS ABS: 0.1 x10 3/mm (ref 0.0–0.7)
EOS PCT: 0.8 %
HCT: 40.6 % (ref 35.0–47.0)
HGB: 13.6 g/dL (ref 12.0–16.0)
Lymphocyte #: 0.4 x10 3/mm — ABNORMAL LOW (ref 1.0–3.6)
Lymphocyte %: 5.6 %
MCH: 31.5 pg (ref 26.0–34.0)
MCHC: 33.6 g/dL (ref 32.0–36.0)
MCV: 94 fL (ref 80–100)
Monocyte #: 0.8 x10 3/mm (ref 0.2–0.9)
Monocyte %: 10.5 %
NEUTROS ABS: 6 x10 3/mm (ref 1.4–6.5)
NEUTROS PCT: 82.5 %
Platelet: 246 x10 3/mm (ref 150–440)
RBC: 4.33 10*6/uL (ref 3.80–5.20)
RDW: 13.3 % (ref 11.5–14.5)
WBC: 7.3 x10 3/mm (ref 3.6–11.0)

## 2014-04-09 DIAGNOSIS — I10 Essential (primary) hypertension: Secondary | ICD-10-CM | POA: Insufficient documentation

## 2014-04-09 DIAGNOSIS — E785 Hyperlipidemia, unspecified: Secondary | ICD-10-CM | POA: Insufficient documentation

## 2014-04-09 DIAGNOSIS — M159 Polyosteoarthritis, unspecified: Secondary | ICD-10-CM | POA: Insufficient documentation

## 2014-04-10 LAB — CBC CANCER CENTER
BASOS ABS: 0.1 x10 3/mm (ref 0.0–0.1)
BASOS PCT: 1.1 %
EOS PCT: 1 %
Eosinophil #: 0 x10 3/mm (ref 0.0–0.7)
HCT: 37.6 % (ref 35.0–47.0)
HGB: 12.7 g/dL (ref 12.0–16.0)
Lymphocyte #: 0.8 x10 3/mm — ABNORMAL LOW (ref 1.0–3.6)
Lymphocyte %: 15.3 %
MCH: 31.6 pg (ref 26.0–34.0)
MCHC: 33.8 g/dL (ref 32.0–36.0)
MCV: 94 fL (ref 80–100)
Monocyte #: 0.6 x10 3/mm (ref 0.2–0.9)
Monocyte %: 12.2 %
Neutrophil #: 3.6 x10 3/mm (ref 1.4–6.5)
Neutrophil %: 70.4 %
Platelet: 240 x10 3/mm (ref 150–440)
RBC: 4.02 10*6/uL (ref 3.80–5.20)
RDW: 13.5 % (ref 11.5–14.5)
WBC: 5.2 x10 3/mm (ref 3.6–11.0)

## 2014-04-17 LAB — CBC CANCER CENTER
BASOS ABS: 0 x10 3/mm (ref 0.0–0.1)
Basophil %: 0.8 %
EOS ABS: 0.1 x10 3/mm (ref 0.0–0.7)
EOS PCT: 0.9 %
HCT: 39.5 % (ref 35.0–47.0)
HGB: 13.1 g/dL (ref 12.0–16.0)
Lymphocyte #: 0.6 x10 3/mm — ABNORMAL LOW (ref 1.0–3.6)
Lymphocyte %: 10.7 %
MCH: 31 pg (ref 26.0–34.0)
MCHC: 33.2 g/dL (ref 32.0–36.0)
MCV: 93 fL (ref 80–100)
MONO ABS: 0.8 x10 3/mm (ref 0.2–0.9)
Monocyte %: 13.4 %
NEUTROS ABS: 4.4 x10 3/mm (ref 1.4–6.5)
NEUTROS PCT: 74.2 %
PLATELETS: 271 x10 3/mm (ref 150–440)
RBC: 4.23 10*6/uL (ref 3.80–5.20)
RDW: 13.2 % (ref 11.5–14.5)
WBC: 5.9 x10 3/mm (ref 3.6–11.0)

## 2014-04-20 ENCOUNTER — Ambulatory Visit: Payer: Self-pay | Admitting: Hematology and Oncology

## 2014-04-24 LAB — CBC CANCER CENTER
Basophil #: 0 x10 3/mm (ref 0.0–0.1)
Basophil %: 0.7 %
Eosinophil #: 0.1 x10 3/mm (ref 0.0–0.7)
Eosinophil %: 1 %
HCT: 39.8 % (ref 35.0–47.0)
HGB: 13.3 g/dL (ref 12.0–16.0)
Lymphocyte #: 0.6 x10 3/mm — ABNORMAL LOW (ref 1.0–3.6)
Lymphocyte %: 11.4 %
MCH: 30.9 pg (ref 26.0–34.0)
MCHC: 33.4 g/dL (ref 32.0–36.0)
MCV: 93 fL (ref 80–100)
Monocyte #: 0.7 x10 3/mm (ref 0.2–0.9)
Monocyte %: 12.3 %
Neutrophil #: 4.1 x10 3/mm (ref 1.4–6.5)
Neutrophil %: 74.6 %
Platelet: 256 x10 3/mm (ref 150–440)
RBC: 4.3 10*6/uL (ref 3.80–5.20)
RDW: 13.1 % (ref 11.5–14.5)
WBC: 5.4 x10 3/mm (ref 3.6–11.0)

## 2014-04-26 ENCOUNTER — Ambulatory Visit: Payer: Self-pay | Admitting: Internal Medicine

## 2014-04-26 DIAGNOSIS — Z853 Personal history of malignant neoplasm of breast: Secondary | ICD-10-CM | POA: Insufficient documentation

## 2014-04-26 DIAGNOSIS — C50419 Malignant neoplasm of upper-outer quadrant of unspecified female breast: Secondary | ICD-10-CM | POA: Insufficient documentation

## 2014-04-26 DIAGNOSIS — G622 Polyneuropathy due to other toxic agents: Secondary | ICD-10-CM | POA: Insufficient documentation

## 2014-04-26 DIAGNOSIS — G6289 Other specified polyneuropathies: Secondary | ICD-10-CM

## 2014-05-18 LAB — CBC CANCER CENTER
Basophil #: 0.1 x10 3/mm (ref 0.0–0.1)
Basophil %: 1.1 %
Eosinophil #: 0 x10 3/mm (ref 0.0–0.7)
Eosinophil %: 0.9 %
HCT: 39.1 % (ref 35.0–47.0)
HGB: 13 g/dL (ref 12.0–16.0)
Lymphocyte #: 0.9 x10 3/mm — ABNORMAL LOW (ref 1.0–3.6)
Lymphocyte %: 17.9 %
MCH: 30.5 pg (ref 26.0–34.0)
MCHC: 33.3 g/dL (ref 32.0–36.0)
MCV: 92 fL (ref 80–100)
Monocyte #: 0.6 x10 3/mm (ref 0.2–0.9)
Monocyte %: 12.1 %
Neutrophil #: 3.3 x10 3/mm (ref 1.4–6.5)
Neutrophil %: 68 %
Platelet: 263 x10 3/mm (ref 150–440)
RBC: 4.27 10*6/uL (ref 3.80–5.20)
RDW: 13.3 % (ref 11.5–14.5)
WBC: 4.9 x10 3/mm (ref 3.6–11.0)

## 2014-05-18 LAB — COMPREHENSIVE METABOLIC PANEL
Albumin: 4 g/dL (ref 3.4–5.0)
Alkaline Phosphatase: 122 U/L — ABNORMAL HIGH
Anion Gap: 8 (ref 7–16)
BUN: 9 mg/dL (ref 7–18)
Bilirubin,Total: 0.4 mg/dL (ref 0.2–1.0)
Calcium, Total: 9.3 mg/dL (ref 8.5–10.1)
Chloride: 90 mmol/L — ABNORMAL LOW (ref 98–107)
Co2: 31 mmol/L (ref 21–32)
Creatinine: 0.61 mg/dL (ref 0.60–1.30)
EGFR (African American): >60
EGFR (Non-African Amer.): >60
Glucose: 89 mg/dL (ref 65–99)
Osmolality: 257 (ref 275–301)
Potassium: 3.8 mmol/L (ref 3.5–5.1)
SGOT(AST): 30 U/L (ref 15–37)
SGPT (ALT): 36 U/L
Sodium: 129 mmol/L — ABNORMAL LOW (ref 136–145)
Total Protein: 6.9 g/dL (ref 6.4–8.2)

## 2014-05-21 ENCOUNTER — Ambulatory Visit: Payer: Self-pay | Admitting: Hematology and Oncology

## 2014-05-21 ENCOUNTER — Ambulatory Visit: Payer: Self-pay | Admitting: Internal Medicine

## 2014-06-14 LAB — COMPREHENSIVE METABOLIC PANEL
Albumin: 3.8 g/dL (ref 3.4–5.0)
Alkaline Phosphatase: 84 U/L
Anion Gap: 4 — ABNORMAL LOW (ref 7–16)
BUN: 9 mg/dL (ref 7–18)
Bilirubin,Total: 0.4 mg/dL (ref 0.2–1.0)
Calcium, Total: 9.7 mg/dL (ref 8.5–10.1)
Chloride: 96 mmol/L — ABNORMAL LOW (ref 98–107)
Co2: 34 mmol/L — ABNORMAL HIGH (ref 21–32)
Creatinine: 0.66 mg/dL (ref 0.60–1.30)
EGFR (African American): >60
EGFR (Non-African Amer.): >60
Glucose: 90 mg/dL (ref 65–99)
Osmolality: 266 (ref 275–301)
Potassium: 3.9 mmol/L (ref 3.5–5.1)
SGOT(AST): 31 U/L (ref 15–37)
SGPT (ALT): 38 U/L
Sodium: 134 mmol/L — ABNORMAL LOW (ref 136–145)
TOTAL PROTEIN: 6.5 g/dL (ref 6.4–8.2)

## 2014-06-14 LAB — MAGNESIUM: Magnesium: 2 mg/dL

## 2014-06-20 ENCOUNTER — Ambulatory Visit: Payer: Self-pay | Admitting: Hematology and Oncology

## 2014-06-20 DIAGNOSIS — Z9289 Personal history of other medical treatment: Secondary | ICD-10-CM

## 2014-06-20 HISTORY — DX: Personal history of other medical treatment: Z92.89

## 2014-07-05 ENCOUNTER — Ambulatory Visit: Payer: Self-pay | Admitting: Hematology and Oncology

## 2014-07-05 DIAGNOSIS — M810 Age-related osteoporosis without current pathological fracture: Secondary | ICD-10-CM | POA: Insufficient documentation

## 2014-07-05 DIAGNOSIS — M818 Other osteoporosis without current pathological fracture: Secondary | ICD-10-CM

## 2014-07-26 ENCOUNTER — Ambulatory Visit: Payer: Self-pay | Admitting: Hematology and Oncology

## 2014-08-21 ENCOUNTER — Ambulatory Visit: Payer: Self-pay | Admitting: Hematology and Oncology

## 2014-09-19 ENCOUNTER — Ambulatory Visit
Admit: 2014-09-19 | Disposition: A | Discharge status: Home / Self Care | Payer: Self-pay | Attending: Hematology and Oncology | Admitting: Hematology and Oncology

## 2014-10-30 ENCOUNTER — Ambulatory Visit
Admit: 2014-10-30 | Disposition: A | Discharge status: Home / Self Care | Payer: Self-pay | Attending: Hematology and Oncology | Admitting: Hematology and Oncology

## 2014-11-10 NOTE — Op Note (Signed)
PATIENT NAME:  Raven, HARVIE MR#:  [id number] DATE OF BIRTH:  07/27/1950  DATE OF PROCEDURE:  06/21/2013  PREOPERATIVE DIAGNOSIS: Left breast cancer.   POSTOPERATIVE DIAGNOSIS:  Left breast cancer.   PROCEDURE PERFORMED: Left breast needle localization excisional biopsy and left axillary sentinel lymph node biopsy.   SURGEON: Sherri Rad, M.D. FACS  ASSISTANT: None.   ANESTHESIA: General with LMA.  OPERATIVE FINDINGS: 1.  Radioactive and blue sentinel lymph node. Frozen section demonstrated no evidence of micrometastases. An additional radioactive and non-blue lymph node was submitted. No palpable lymphadenopathy was noted in the axilla. Background count was 55.  Ex vivo count of 10 seconds lymph node as described above was 1675.   ESTIMATED BLOOD LOSS: Minimal.   DRAINS: None.   DESCRIPTION OF PROCEDURE: After appropriate localization and sentinel lymph node injection in radiology, the patient was brought to the operating room and positioned supine. General anesthesia was induced. A total of 1 mL of Lymphazurin blue was infiltrated around the periareolar subdermal injection. The area was massaged for 5 minutes. The patient's left breast was sterilely prepped and draped with ChloraPrep solution. Wire was amputated near the skin site. Timeout was observed.   An axillary curvilinear incision at the inferior area of the hair bearing  region on the left side was fashioned and carried through clavipectoral fascia with electrocautery. Neoprobe interrogation demonstrated an area of increased uptake. Gentle dissection in this area demonstrated a blue lymphatic channel which was followed to a lymph node, which was excised with electrocautery and submitted to pathology as described above. In the process of doing this, a non-blue nonradioactive lymph node was identified and excised. Palpation of the axilla demonstrated no evidence of bulky lymphadenopathy. There was no other blue channels. Intra-operative  frozen section was negative.  The clavipectoral fascia was reapproximated utilizing interrupted 3-0 Vicryl sutures. 10 mL of 0.25% plain Marcaine was infiltrated into the area. A 4-0 running subcuticular stitch was then placed followed by benzoin, Steri-Strips, and occlusive sterile dressing.   Attention was then turned to the lumpectomy procedure at which point the transversely oriented breast incision was fashioned in the left mid breast encompassing the wire entrance site, approximately 6 cm lateral to the nipple. Subcutaneous tissues were divided with electrocautery and subdermal flaps were raised circumferentially. Wire site was identified. Circumferential excisional biopsy with a greater than 1 cm margin was obtained with electrocautery. Hemostasis was obtained with point cautery. The specimen was oriented with margin markers and submitted for mammographic analysis. The previously placed biopsied marker was demonstrated in the center of the specimen. Pathology demonstrated this to have greater than 1 cm margins by gross analysis.   The remaining local anesthesia was infiltrated in the biopsy cavity. The biopsy cavity was marked at a space with multiple 5 mm titanium hemoclips. The wound was then reapproximated in the deep layer with interrupted 3-0 Vicryl. Interrupted inverted 3-0 Vicryl were then placed. Running 4-0 Vicryl subcuticular was applied to the skin. Benzoin, Steri-Strips, and occlusive sterile dressing was placed. The patient was then subsequently extubated and taken to the recovery room in stable and satisfactory condition by anesthesia services.   ____________________________ Jeannette Barber Marina Gravel, MD FACS mab:dp D: 06/21/2013 15:27:33 ET T: 06/21/2013 16:31:41 ET JOB#: [id number]  cc: Elta Guadeloupe A. Marina Gravel, MD, <Dictator> Rae Halsted. Kallie Edward, MD Hortencia Conradi MD ELECTRONICALLY SIGNED 06/24/2013 7:07

## 2014-11-11 NOTE — Op Note (Signed)
PATIENT NAME:  Raven Barber, Raven Barber MR#:  [id number] DATE OF BIRTH:  September 07, 1950  DATE OF PROCEDURE:  08/12/2013  PREOPERATIVE DIAGNOSIS: Stage II breast cancer, left side.   POSTOPERATIVE DIAGNOSIS: Stage II breast cancer, left side.   PROCEDURE PERFORMED: Right internal jugular vein Port-A-Cath insertion.   SURGEON: Sherri Rad, MD   ASSISTANT: None.   ANESTHESIA: Local with heavy intravenous sedation and monitored anesthesia care.   DESCRIPTION OF PROCEDURE: With informed consent, in supine position, percutaneous monitoring lines were placed. Intravenous sedation was administered. The patient's right neck and chest were sterilely prepped and draped with ChloraPrep solution and Ioban. A shoulder roll was placed.   Timeout was observed.   Utilizing handheld sonography sterilely draped, the course of the internal jugular vein on the right side was identified with identification of compressibility. Utilizing a 22-gauge finder needle and the patient in Trendelenburg position, the needle was placed into the vein on first pass. A larger bore catheter needle was placed. Wire was passed. Some ectopy was noted. Local anesthesia was infiltrated on the anterior chest wall on the right side. A subcutaneous pocket was fashioned with electrocautery down to the clavipectoral fascia. A small skin incision was then fashioned over the wire. Tubing was then brought between these 2 sites.   Utilizing a peel-away introducer with dilator, the vein was dilated. Wire and dilator were removed. The catheter was inserted. Peel-away introducer was removed. The catheter was then shortened to the appropriate length under dynamic fluoroscopy. It was amputated near the chest wall exit site and secured to the reservoir. Flushed and aspirated without difficulty, and 2.5 mL of 1000 units/mL heparinized saline was instilled. Final fluoroscopy demonstrated no kinking of the catheter with adequate placement and no evidence of ectopy on the  monitor.   The port was then secured to the clavipectoral fascia at 2 points with 3-0 PDS. Running 3-0 Vicryl deep dermal was then placed, followed by 4-0 Vicryl subcuticular in the skin, followed by the application of Dermabond. Sterile occlusive dressings were then placed, and the patient was subsequently taken to the recovery room in stable and satisfactory condition where a portable chest x-ray was obtained.    ____________________________ Jeannette How. Marina Gravel, MD mab:jcm D: 08/12/2013 17:02:13 ET T: 08/12/2013 19:42:45 ET JOB#: [id number]  cc: Elta Guadeloupe A. Marina Gravel, MD, <Dictator> Rae Halsted. Kallie Edward, MD Hortencia Conradi MD ELECTRONICALLY SIGNED 08/16/2013 8:26

## 2014-11-11 NOTE — Consult Note (Signed)
Reason for Visit: This 64 year old Female patient presents to the clinic for initial evaluation of  breast cancer .   Referred by Dr. Kallie Edward.  Diagnosis:  Chief Complaint/Diagnosis   64 year old female status post wide local excision and sentinel node biopsy for a stage I (T1 B. N0 M0) invasive mammary carcinoma ER positive PR borderline HER-2/neu not overexpressed  Pathology Report pathology report reviewed   Imaging Report mammograms reviewed   Referral Report clinical notes reviewed   Planned Treatment Regimen whole breast radiation Oncotype DX pending   HPI   patient is a pleasant 64 year old female who presents with an abnormal mammogram of her left breast showing a proximal 7 mm lesion in the lower inner quadrant of the left breast confirmed on ultrasound to be a hypoechoic mass with irregular margins and recommendation for biopsy was made. Tumor was invasive mammary carcinoma ER positive PR borderline HER-2/neu not overexpressed. Went on to have a wide local excision and sentinel node biopsy. Initial biopsy there was 0.5 CM of tumor on the wide local excision with 7 mm of residual disease. 2 sentinel lymph nodes were negative. Margins were clear at 4 mm. Tumor was overall grade 1.she is done well postoperatively. Oncotype DX has been ordered and is pending. She seen today for consultation regarding radiation therapy she is doing well she is well-healed from her surgery. She specifically denies breast tenderness cough or bone pain.  Past Hx:    high cholesterol:    hypertension:    Lumpectomy-Left breast:   Past, Family and Social History:  Past Medical History positive   Cardiovascular hyperlipidemia; hypertension   Family History positive   Family History Comments and with breast cancer in her 19s cousin with breast cancer at 70   Social History noncontributory   Additional Past Medical and Surgical History accompanied by her husband today   Allergies:   No Known  Allergies:   Home Meds:  Home Medications: Medication Instructions Status  Calcium 600+D 600 mg-200 units oral tablet 1 tab(s) orally 3 times a day Active  pravastatin 40 mg oral tablet 1 tab(s) orally once a day (at bedtime) Active  lysine 500 mg oral tablet 1 tab(s) orally 2 times a day Active  hydrochlorothiazide 12.5 mg oral tablet 1 tab(s) orally once a day (in the morning) Active  Fish Oil 1000 mg oral capsule 1 cap(s) orally once a day (in the morning) Active  amLODIPine 5 mg oral tablet 1 tab(s) orally once a day (in the morning) Active   Review of Systems:  General negative   Performance Status (ECOG) 0   Skin negative   Breast see HPI   Ophthalmologic negative   ENMT negative   Respiratory and Thorax negative   Cardiovascular negative   Gastrointestinal negative   Genitourinary negative   Musculoskeletal negative   Neurological negative   Psychiatric negative   Hematology/Lymphatics negative   Endocrine negative   Allergic/Immunologic negative   Review of Systems   review of systems obtained from nurses notes  Nursing Notes:  Nursing Vital Signs and Chemo Nursing Nursing Notes: *CC Vital Signs Flowsheet:   07-Jan-15 10:54  Temp Temperature 97.3  Pulse Pulse 73  SBP SBP 133  DBP DBP 93  Pain Scale (0-10)  0  Current Weight (kg) (kg) 57  Height (cm) centimeters 154.9  BSA (m2) 1.5   Physical Exam:  General/Skin/HEENT:  General normal   Skin normal   Eyes normal   ENMT normal  Head and Neck normal   Additional PE well-developed female in NAD. She status post wide local excision of the left breast. Incision is well-healed. No dominant mass or nodularity is noted in either breast into position examined. No axillary or supraclavicular adenopathy is noted. Lungs are clear to A&P cardiac examination shows regular rate and rhythm. Abdomen is benign.   Breasts/Resp/CV/GI/GU:  Respiratory and Thorax normal   Cardiovascular normal    Gastrointestinal normal   Genitourinary normal   MS/Neuro/Psych/Lymph:  Musculoskeletal normal   Neurological normal   Lymphatics normal   Other Results:  Radiology Results: LabUnknown:    06-Nov-14 10:19, Digital Additional Views Lt Breast [facility])  PACS Image   [facility]:  Digital Additional Views Lt Breast (SCR)   REASON FOR EXAM:    av lt mass  COMMENTS:       PROCEDURE: MAM - MAM DGTL ADD VW LT  SCR  - May 26 2013 10:19AM     CLINICAL DATA:  Screening recall for a left breast mass.    EXAM:  DIGITAL DIAGNOSTIC  LEFT MAMMOGRAM    ULTRASOUND LEFT BREAST    COMPARISON:  Previous exams.    ACR Breast Density Category b: There are scattered areas of  fibroglandular density.    FINDINGS:  There is a mass in the lower inner left breast mid depth measuring  approximately 7 mm, confirmed on the additional CC and MLO spot  compression views    Physical examination of the lower inner left breast does not reveal  any palpable abnormalities.    Targeted ultrasound of the left breast was performed demonstrating a  hypoechoic mass with slightly irregular margins at8 o'clock 4 cm  from the nipple measuring 0.7 x 0.5 x 0.6 cm. This corresponds with  mammography findings.  No definite lymphadenopathy is seen in the left axilla.     IMPRESSION:  Suspicious left breast mass.    RECOMMENDATION:  Ultrasound-guided biopsy of the suspicious mass in the left breast  is recommended. This is scheduled for 06/01/2013 at 1 p.m..    I have discussed the findings and recommendations with the patient.  Results were also provided in writing at the conclusion of the  visit. If applicable, a reminder letter will be sent to the patient  regarding the next appointment.    BI-RADS CATEGORY  4: Suspicious abnormality - biopsy should be  considered.      Electronically Signed    By: Everlean Alstrom M.D.    On: 05/26/2013 11:40         Verified By: Trudee Kuster, M.D.,    Relevent Results:   Relevant Scans and Labs mammograms ultrasound were reviewed   Assessment and Plan: Impression:   stage I breast cancer in 64 year old female Oncotype DX pending. Plan:   at this time I recommend whole breast radiation to her left breast. Would plan on delivering 5000 cGy over 5 weeks boosting or scar another 1400 cGy using electron beam. At this time I'll wait Oncotype DX before at doing simulation for if she is a candidate for systemic chemotherapy we'll defer radiation for left that is completed. I have personally discuss the case with Dr. Kallie Edward. Risks and benefits of radiation including skin reaction, fatigue, alteration blood counts and thickening of the breast overall and possible occlusion of some superficial lung were all explained in detail to the patient. She seems to comprehend my treatment plan well. One was given for followup with medical oncology  and simulation tentatively was set up for a day after that appointment.  I would like to take this opportunity to thank you for allowing me to continue to participate in this patient's care.  CC Referral:  cc: Dr. Marina Gravel, Dr. Frazier Richards   Electronic Signatures: Baruch Gouty, Roda Shutters (MD)  (Signed 07-Jan-15 11:36)  Authored: HPI, Diagnosis, Past Hx, PFSH, Allergies, Home Meds, ROS, Nursing Notes, Physical Exam, Other Results, Relevent Results, Encounter Assessment and Plan, CC Referring Physician   Last Updated: 07-Jan-15 11:36 by Armstead Peaks (MD)

## 2014-11-15 HISTORY — PX: PORT-A-CATH REMOVAL: SHX5289

## 2014-12-11 ENCOUNTER — Inpatient Hospital Stay: Payer: PPO | Attending: Hematology and Oncology

## 2014-12-11 ENCOUNTER — Telehealth: Payer: Self-pay

## 2014-12-11 ENCOUNTER — Inpatient Hospital Stay (HOSPITAL_BASED_OUTPATIENT_CLINIC_OR_DEPARTMENT_OTHER): Payer: PPO | Admitting: Hematology and Oncology

## 2014-12-11 ENCOUNTER — Encounter: Payer: Self-pay | Admitting: Hematology and Oncology

## 2014-12-11 VITALS — BP 109/70 | HR 69 | Temp 98.0°F | Ht 60.94 in | Wt 123.2 lb

## 2014-12-11 DIAGNOSIS — C50912 Malignant neoplasm of unspecified site of left female breast: Secondary | ICD-10-CM

## 2014-12-11 DIAGNOSIS — Z923 Personal history of irradiation: Secondary | ICD-10-CM | POA: Insufficient documentation

## 2014-12-11 DIAGNOSIS — I10 Essential (primary) hypertension: Secondary | ICD-10-CM

## 2014-12-11 DIAGNOSIS — Z170 Estrogen receptor positive status [ER+]: Secondary | ICD-10-CM | POA: Insufficient documentation

## 2014-12-11 DIAGNOSIS — M810 Age-related osteoporosis without current pathological fracture: Secondary | ICD-10-CM | POA: Insufficient documentation

## 2014-12-11 DIAGNOSIS — Z79899 Other long term (current) drug therapy: Secondary | ICD-10-CM | POA: Diagnosis not present

## 2014-12-11 DIAGNOSIS — Z79811 Long term (current) use of aromatase inhibitors: Secondary | ICD-10-CM | POA: Diagnosis not present

## 2014-12-11 DIAGNOSIS — G620 Drug-induced polyneuropathy: Secondary | ICD-10-CM

## 2014-12-11 DIAGNOSIS — Z9221 Personal history of antineoplastic chemotherapy: Secondary | ICD-10-CM | POA: Diagnosis not present

## 2014-12-11 DIAGNOSIS — E780 Pure hypercholesterolemia: Secondary | ICD-10-CM

## 2014-12-11 LAB — CBC WITH DIFFERENTIAL/PLATELET
Basophils Absolute: 0 10*3/uL (ref 0–0.1)
Basophils Relative: 1 %
Eosinophils Absolute: 0.1 10*3/uL (ref 0–0.7)
Eosinophils Relative: 1 %
HCT: 41.2 % (ref 35.0–47.0)
Hemoglobin: 13.5 g/dL (ref 12.0–16.0)
Lymphocytes Relative: 17 %
Lymphs Abs: 1 10*3/uL (ref 1.0–3.6)
MCH: 30.5 pg (ref 26.0–34.0)
MCHC: 32.9 g/dL (ref 32.0–36.0)
MCV: 92.8 fL (ref 80.0–100.0)
Monocytes Absolute: 0.7 10*3/uL (ref 0.2–0.9)
Monocytes Relative: 11 %
Neutro Abs: 4.1 10*3/uL (ref 1.4–6.5)
Neutrophils Relative %: 70 %
Platelets: 286 10*3/uL (ref 150–440)
RBC: 4.44 MIL/uL (ref 3.80–5.20)
RDW: 13.2 % (ref 11.5–14.5)
WBC: 5.8 10*3/uL (ref 3.6–11.0)

## 2014-12-11 LAB — COMPREHENSIVE METABOLIC PANEL
ALT: 36 U/L (ref 14–54)
AST: 47 U/L — ABNORMAL HIGH (ref 15–41)
Albumin: 4.7 g/dL (ref 3.5–5.0)
Alkaline Phosphatase: 105 U/L (ref 38–126)
Anion gap: 6 (ref 5–15)
BUN: 10 mg/dL (ref 6–20)
CO2: 31 mmol/L (ref 22–32)
Calcium: 9.2 mg/dL (ref 8.9–10.3)
Chloride: 93 mmol/L — ABNORMAL LOW (ref 101–111)
Creatinine, Ser: 0.57 mg/dL (ref 0.44–1.00)
GFR calc Af Amer: >60 mL/min (ref >60)
GFR calc non Af Amer: >60 mL/min (ref >60)
Glucose, Bld: 97 mg/dL (ref 65–99)
Potassium: 4.4 mmol/L (ref 3.5–5.1)
Sodium: 130 mmol/L — ABNORMAL LOW (ref 135–145)
Total Bilirubin: 0.7 mg/dL (ref 0.3–1.2)
Total Protein: 7.2 g/dL (ref 6.5–8.1)

## 2014-12-11 NOTE — Telephone Encounter (Signed)
Spoke with Gerald Stabs in Dr. Tonette Bihari office regarding patient's sodium levels; he stated he would pass the information along to Dr. Ouida Sills

## 2014-12-11 NOTE — Progress Notes (Signed)
[place] [facility] day:  12/11/2014  Chief Complaint: Raven Barber is an 64 y.o. female with a history of stage I left breast cancer who is seen for 3 month assessment.  HPI: The patient was last seen in the medical oncology clinic on 09/11/2014.  At that time, she was seen for initial assessment by me.  She was on Femara and tolerating it well.  Mammogram from 07/15/2014 was negative.  Bone density from 06/2014 revealed osteoporosis.  We discussed calcium and vitamin D.  We discussed bisphosphonates.  We discussed consideration of Prolia every 6 months.  We discussed port-a-cath removal.  During the interim, she has done well.  She is tolerating her Femara.  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  She saw the dentist in 03/2014.  There were no dental issues.  She is taking her calcium and vitamin D for her osteoporosis.  She had her port removed on 11/15/2014.  Past Medical History  Diagnosis Date  . Hypercholesteremia   . Hypertension   . Breast cancer, left breast   . History of colonoscopy 2012  . History of mammogram 06/2014  . History of bone density study 06/2014  . History of Papanicolaou smear of cervix 2013  . Breast cancer     Past Surgical History  Procedure Laterality Date  . Portacath placement    . Breast lumpectomy  06/21/2013  . Tubal ligation    . Port-a-cath removal  11/15/2014   Family History  Problem Relation Age of Onset  . Breast cancer Cousin 67  . Breast cancer Maternal Aunt 41   Social History:  reports that she has never smoked. She has never used smokeless tobacco. She reports that she does not drink alcohol or use illicit drugs.  The patient is accompanied by her husband  Allergies:  Allergies  Allergen Reactions  . Lisinopril     Other reaction(s): Cough   Current Medications: Current Outpatient Prescriptions  Medication Sig Dispense Refill  . Calcium 600-200 MG-UNIT  per tablet Take 2 tablets by mouth daily.    . Omega-3 Fatty Acids (FISH OIL) 1000 MG CAPS Take 1 capsule by mouth daily.    Marland Kitchen amLODipine (NORVASC) 5 MG tablet Take 5 mg by mouth daily.  0  . gabapentin (NEURONTIN) 100 MG capsule Take 100 mg by mouth 2 (two) times daily.  0  . hydrochlorothiazide (MICROZIDE) 12.5 MG capsule Take 12.5 mg by mouth daily.  0  . letrozole (FEMARA) 2.5 MG tablet Take 2.5 mg by mouth daily.  0  . pravastatin (PRAVACHOL) 40 MG tablet Take 40 mg by mouth daily.  0   No current facility-administered medications for this visit.   Review of Systems:  GENERAL:  Feels good.  Active.  No fevers, sweats or weight loss. PERFORMANCE STATUS (ECOG):  1 HEENT:  No visual changes, runny nose, sore throat, mouth sores or tenderness.  No dental issues. Lungs: No shortness of breath or cough.  No hemoptysis. Cardiac:  No chest pain, palpitations, orthopnea, or PND. GI:  No nausea, vomiting, diarrhea, constipation, melena or hematochezia. GU:  No urgency, frequency, dysuria, or hematuria. Musculoskeletal:  No back pain.  No joint pain.  No muscle tenderness. Extremities:  No pain or swelling. Skin:  No rashes or skin changes. Neuro:  Residual neuropathy in fingertips. No headache, numbness or weakness, balance or coordination issues. Endocrine:  No diabetes, thyroid issues, hot flashes or  night sweats. Psych:  No mood changes, depression or anxiety. Pain:  No focal pain. Review of systems:  All other systems reviewed and found to be negative.  Physical Exam: Blood pressure 109/70, pulse 69, temperature 98 F (36.7 C), temperature source Tympanic, height 5' 0.94" (1.548 m), weight 123 lb 3.8 oz (55.9 kg), SpO2 100 %. GENERAL:  Well developed, well nourished, sitting comfortably in the exam room in no acute distress. MENTAL STATUS:  Alert and oriented to person, place and time. HEAD:  Dominica Severin styled hair.  Normocephalic, atraumatic, face symmetric, no Cushingoid features. EYES:   Glasses.  Hazel eyes.  Pupils equal round and reactive to light and accomodation.  No conjunctivitis or scleral icterus. ENT:  Oropharynx clear without lesion.  Tongue normal. Mucous membranes moist.  RESPIRATORY:  Clear to auscultation without rales, wheezes or rhonchi. CARDIOVASCULAR:  Regular rate and rhythm without murmur, rub or gallop. BREAST:  Right breast without masses, skin changes or nipple discharge.  Left breast with post-operative changes.  No discrete masses, skin changes or nipple discharge. ABDOMEN:  Soft, non-tender, with active bowel sounds, and no hepatosplenomegaly.  No masses. SKIN:  No rashes, ulcers or lesions. EXTREMITIES: No edema, no skin discoloration or tenderness.  No palpable cords. LYMPH NODES: No palpable cervical, supraclavicular, axillary or inguinal adenopathy  NEUROLOGICAL: Unremarkable. PSYCH:  Appropriate.  Office Visit on 12/11/2014  Component Date Value Ref Range Status  . Sodium 12/11/2014 130* 135 - 145 mmol/L Final  . Potassium 12/11/2014 4.4  3.5 - 5.1 mmol/L Final  . Chloride 12/11/2014 93* 101 - 111 mmol/L Final  . CO2 12/11/2014 31  22 - 32 mmol/L Final  . Glucose, Bld 12/11/2014 97  65 - 99 mg/dL Final  . BUN 12/11/2014 10  6 - 20 mg/dL Final  . Creatinine, Ser 12/11/2014 0.57  0.44 - 1.00 mg/dL Final  . Calcium 12/11/2014 9.2  8.9 - 10.3 mg/dL Final  . Total Protein 12/11/2014 7.2  6.5 - 8.1 g/dL Final  . Albumin 12/11/2014 4.7  3.5 - 5.0 g/dL Final  . AST 12/11/2014 47* 15 - 41 U/L Final  . ALT 12/11/2014 36  14 - 54 U/L Final  . Alkaline Phosphatase 12/11/2014 105  38 - 126 U/L Final  . Total Bilirubin 12/11/2014 0.7  0.3 - 1.2 mg/dL Final  . GFR calc non Af Amer 12/11/2014 >60  >60 mL/min Final  . GFR calc Af Amer 12/11/2014 >60  >60 mL/min Final   Comment: (NOTE) The eGFR has been calculated using the CKD EPI equation. This calculation has not been validated in all clinical situations. eGFR's persistently <60 mL/min signify  possible Chronic Kidney Disease.   . Anion gap 12/11/2014 6  5 - 15 Final    Assessment:  Raven Barber is an 64 y.o. female woman with a history of stage I (T1bN0M0) left breast cancer status post lumpectomy and sentinel lymph node biopsy on 06/21/2013.  Pathology revealed a 7 mm grade II invasive mammary tumor.  Tumor was ER positive (>90%), PR negative (< 1%), and Her2/neu negative.  Two lymph nodes were negative.  Oncotype DX testing revealed a recurrence score of 21.  She received 4 cycles of adriamycin and Cytoxan (08/18/2013 - 10/20/2013) and 11-12 weeks of Taxol (11/10/2013 - 01/19/2014).  Her last 3 cycles of Taxol were dose reduced secondary to significant neuropathy.  She received radiation from 03/06/2014 - 04/27/2014.  Since completion of radiation, she has been on Femara.  She underwent MyRisk  genetic testing.  She had no deleterious mutation, but a variance of unknown significance (VUS) in the ATM gene which conveys a high risk for breast cancer and an increased risk of pancreatic cancer.  Bilateral diagnostic mammogram on 07/05/2014 was negative.  Bone density study on 07/05/2014 revealed osteoporosis with a T-score of -2.9 in L1-L4 and T score of -2.3 in the left femoral neck.  She is on calcium and vitamin D.  Symptomatically, she is tolerating her Femara well.  She has a residual grade I/II in her feet and a grade I neuropathy in her fingertips.  Exam is stable.   Plan: 1. Labs today:  CBC with diff, CMP, CA27.29. 2. Discuss treatment of osteoporosis.  Patient would like to proceed with Prolia once pre-authorization complete. 3. Continue Femara. 4. Patient to follow-up with PCP regarding chronic hyponatremia.  Juliann Pulse to contact Dr. Tonette Bihari nurse. 5. RTC for BMP and Prolia once pre-authorized. 6. RTC in 4 months for MD assessment and labs (CBC with diff, CMP, CA27.29).  Lequita Asal, MD  12/11/2014, 11:03 AM

## 2014-12-12 LAB — CA 27.29 (SERIAL MONITOR): CA 27.29: 46.1 U/mL — ABNORMAL HIGH (ref 0.0–38.6)

## 2014-12-29 ENCOUNTER — Telehealth: Payer: Self-pay | Admitting: Hematology and Oncology

## 2014-12-29 ENCOUNTER — Other Ambulatory Visit: Payer: Self-pay | Admitting: Hematology and Oncology

## 2014-12-29 DIAGNOSIS — C50912 Malignant neoplasm of unspecified site of left female breast: Secondary | ICD-10-CM

## 2014-12-29 NOTE — Telephone Encounter (Signed)
Re:  Elevated CA-27-29  Today I spoke with the patient regarding her elevated CA-27-29 from 12/11/2014. Previously, a message had been left on the patient's phone.  She called back and left a message. I returned her call today. I reviewed her elevated marker. The significance is unclear. I discussed rechecking her CA-27-29. If this remains elevated, she will  to undergo a workup. She denied any complaints.  Lequita Asal, MD

## 2015-01-01 ENCOUNTER — Inpatient Hospital Stay: Payer: PPO | Attending: Hematology and Oncology

## 2015-01-01 DIAGNOSIS — C50912 Malignant neoplasm of unspecified site of left female breast: Secondary | ICD-10-CM

## 2015-01-02 LAB — CANCER ANTIGEN 27.29: CA 27.29: 42 U/mL — ABNORMAL HIGH (ref 0.0–38.6)

## 2015-01-15 ENCOUNTER — Other Ambulatory Visit: Payer: Self-pay | Admitting: Family Medicine

## 2015-04-16 ENCOUNTER — Other Ambulatory Visit: Payer: PPO

## 2015-04-16 ENCOUNTER — Ambulatory Visit: Payer: PPO | Admitting: Hematology and Oncology

## 2015-04-23 ENCOUNTER — Encounter: Payer: Self-pay | Admitting: Hematology and Oncology

## 2015-04-23 ENCOUNTER — Inpatient Hospital Stay (HOSPITAL_BASED_OUTPATIENT_CLINIC_OR_DEPARTMENT_OTHER): Payer: PPO | Admitting: Hematology and Oncology

## 2015-04-23 ENCOUNTER — Telehealth: Payer: Self-pay

## 2015-04-23 ENCOUNTER — Inpatient Hospital Stay: Payer: PPO | Attending: Hematology and Oncology

## 2015-04-23 VITALS — BP 132/75 | HR 77 | Temp 98.6°F | Resp 18 | Wt 119.4 lb

## 2015-04-23 DIAGNOSIS — Z9221 Personal history of antineoplastic chemotherapy: Secondary | ICD-10-CM

## 2015-04-23 DIAGNOSIS — E7800 Pure hypercholesterolemia, unspecified: Secondary | ICD-10-CM | POA: Diagnosis not present

## 2015-04-23 DIAGNOSIS — E871 Hypo-osmolality and hyponatremia: Secondary | ICD-10-CM

## 2015-04-23 DIAGNOSIS — C50912 Malignant neoplasm of unspecified site of left female breast: Secondary | ICD-10-CM

## 2015-04-23 DIAGNOSIS — R978 Other abnormal tumor markers: Secondary | ICD-10-CM

## 2015-04-23 DIAGNOSIS — T451X5S Adverse effect of antineoplastic and immunosuppressive drugs, sequela: Secondary | ICD-10-CM | POA: Insufficient documentation

## 2015-04-23 DIAGNOSIS — M810 Age-related osteoporosis without current pathological fracture: Secondary | ICD-10-CM | POA: Diagnosis not present

## 2015-04-23 DIAGNOSIS — Z803 Family history of malignant neoplasm of breast: Secondary | ICD-10-CM | POA: Insufficient documentation

## 2015-04-23 DIAGNOSIS — Z923 Personal history of irradiation: Secondary | ICD-10-CM | POA: Diagnosis not present

## 2015-04-23 DIAGNOSIS — G620 Drug-induced polyneuropathy: Secondary | ICD-10-CM | POA: Insufficient documentation

## 2015-04-23 DIAGNOSIS — I10 Essential (primary) hypertension: Secondary | ICD-10-CM

## 2015-04-23 DIAGNOSIS — Z79811 Long term (current) use of aromatase inhibitors: Secondary | ICD-10-CM | POA: Insufficient documentation

## 2015-04-23 DIAGNOSIS — Z170 Estrogen receptor positive status [ER+]: Secondary | ICD-10-CM | POA: Insufficient documentation

## 2015-04-23 DIAGNOSIS — Z79899 Other long term (current) drug therapy: Secondary | ICD-10-CM | POA: Insufficient documentation

## 2015-04-23 LAB — URINALYSIS COMPLETE WITH MICROSCOPIC (ARMC ONLY)
Bilirubin Urine: NEGATIVE
Glucose, UA: NEGATIVE mg/dL
Ketones, ur: NEGATIVE mg/dL
Nitrite: NEGATIVE
Protein, ur: NEGATIVE mg/dL
Specific Gravity, Urine: 1.003 — ABNORMAL LOW (ref 1.005–1.030)
pH: 7 (ref 5.0–8.0)

## 2015-04-23 LAB — COMPREHENSIVE METABOLIC PANEL
ALT: 26 U/L (ref 14–54)
AST: 38 U/L (ref 15–41)
Albumin: 4.4 g/dL (ref 3.5–5.0)
Alkaline Phosphatase: 86 U/L (ref 38–126)
Anion gap: 5 (ref 5–15)
BUN: 8 mg/dL (ref 6–20)
CO2: 31 mmol/L (ref 22–32)
Calcium: 8.5 mg/dL — ABNORMAL LOW (ref 8.9–10.3)
Chloride: 89 mmol/L — ABNORMAL LOW (ref 101–111)
Creatinine, Ser: 0.65 mg/dL (ref 0.44–1.00)
GFR calc Af Amer: >60 mL/min (ref >60)
GFR calc non Af Amer: >60 mL/min (ref >60)
Glucose, Bld: 106 mg/dL — ABNORMAL HIGH (ref 65–99)
Potassium: 3.8 mmol/L (ref 3.5–5.1)
Sodium: 125 mmol/L — ABNORMAL LOW (ref 135–145)
Total Bilirubin: 0.7 mg/dL (ref 0.3–1.2)
Total Protein: 7.2 g/dL (ref 6.5–8.1)

## 2015-04-23 LAB — CBC WITH DIFFERENTIAL/PLATELET
Basophils Absolute: 0 10*3/uL (ref 0–0.1)
Basophils Relative: 1 %
Eosinophils Absolute: 0 10*3/uL (ref 0–0.7)
Eosinophils Relative: 1 %
HCT: 38.8 % (ref 35.0–47.0)
Hemoglobin: 13.4 g/dL (ref 12.0–16.0)
Lymphocytes Relative: 17 %
Lymphs Abs: 1 10*3/uL (ref 1.0–3.6)
MCH: 31.5 pg (ref 26.0–34.0)
MCHC: 34.4 g/dL (ref 32.0–36.0)
MCV: 91.4 fL (ref 80.0–100.0)
Monocytes Absolute: 0.6 10*3/uL (ref 0.2–0.9)
Monocytes Relative: 11 %
Neutro Abs: 4.3 10*3/uL (ref 1.4–6.5)
Neutrophils Relative %: 70 %
Platelets: 288 10*3/uL (ref 150–440)
RBC: 4.24 MIL/uL (ref 3.80–5.20)
RDW: 13 % (ref 11.5–14.5)
WBC: 6.1 10*3/uL (ref 3.6–11.0)

## 2015-04-23 LAB — URIC ACID: Uric Acid, Serum: 3.2 mg/dL (ref 2.3–6.6)

## 2015-04-23 LAB — OSMOLALITY: Osmolality: 266 mOsm/kg — ABNORMAL LOW (ref 275–295)

## 2015-04-23 LAB — SODIUM, URINE, RANDOM: Sodium, Ur: 22 mmol/L

## 2015-04-23 LAB — OSMOLALITY, URINE: Osmolality, Ur: 130 mOsm/kg — ABNORMAL LOW (ref 300–900)

## 2015-04-23 LAB — TSH: TSH: 2.233 u[IU]/mL (ref 0.350–4.500)

## 2015-04-23 NOTE — Telephone Encounter (Signed)
Called Dr. Tonette Bihari office to let them know that pt had abnormal labs.  Receptionist took my name and numbers to give information top nurse to have her call me me back.  Asked to speak directly to nurse and she said she would take a message for Dr. Tonette Bihari nurse.

## 2015-04-23 NOTE — Progress Notes (Signed)
No changes since last visit.  Follow up breast

## 2015-04-23 NOTE — Progress Notes (Signed)
[facility] day:  04/23/2015  Chief Complaint: Raven Barber is an 64 y.o. female with a history of stage I left breast cancer who is seen for 4 month assessment.  HPI: The patient was last seen in the medical oncology clinic on 12/11/2014.  At that time, she was doing well.  She was tolerating her Femara.  She still had some [place] neuropathy in her fingertips secondary to Taxol.  She saw the dentist in 03/2014.  There were no dental issues.  She was taking her calcium and vitamin D for her osteoporosis.  Sodium was 130.  Her primary care physician was contacted.  CA27.29 was 46.1.  Repeat was 42.0 (0-38.6) on 01/01/2015.  During the interim, she notes nothing is new. He continues to have neuropathy in her feet. Weight is down as she is trying to eat healthier.  Regarding her fluid intake, she states that she "drinks a lot of water". She is still taking hydrochlorothiazide (HCTZ).  She denies any breast concerns.  Past Medical History  Diagnosis Date  . Hypercholesteremia   . Hypertension   . Breast cancer, left breast ([place])   . History of colonoscopy 2012  . History of mammogram 06/2014  . History of bone density study 06/2014  . History of Papanicolaou smear of cervix 2013  . Breast cancer [facility])     Past Surgical History  Procedure Laterality Date  . Portacath placement    . Breast lumpectomy  06/21/2013  . Tubal ligation    . Port-a-cath removal  11/15/2014   Family History  Problem Relation Age of Onset  . Breast cancer Cousin 22  . Breast cancer Maternal Aunt 49  . Cancer Paternal Uncle     lung   Social History:  reports that she has never smoked. She has never used smokeless tobacco. She reports that she does not drink alcohol or use illicit drugs.  The patient is accompanied by her husband  Allergies:  Allergies  Allergen Reactions  . Lisinopril     Other reaction(s): Cough   Current Medications: Current Outpatient  Prescriptions  Medication Sig Dispense Refill  . amLODipine (NORVASC) 5 MG tablet Take 5 mg by mouth daily.  0  . Calcium 600-200 MG-UNIT per tablet Take 2 tablets by mouth daily.    Marland Kitchen gabapentin (NEURONTIN) 100 MG capsule take 1 capsule by mouth twice a day 60 capsule 3  . hydrochlorothiazide (MICROZIDE) 12.5 MG capsule Take 12.5 mg by mouth daily.  0  . letrozole (FEMARA) 2.5 MG tablet Take 2.5 mg by mouth daily.  0  . Omega-3 Fatty Acids (FISH OIL) 1000 MG CAPS Take 1 capsule by mouth daily.    . pravastatin (PRAVACHOL) 40 MG tablet Take 40 mg by mouth daily.  0   No current facility-administered medications for this visit.   Review of Systems:  GENERAL:  Feels good.  No fevers or sweats.  Weight loss of 2 punds secondary to healthy eating.Marland Kitchen PERFORMANCE STATUS (ECOG):  1 HEENT:  No visual changes, runny nose, sore throat, mouth sores or tenderness.  No dental issues. Lungs: No shortness of breath or cough.  No hemoptysis. Cardiac:  No chest pain, palpitations, orthopnea, or PND. GI:  No nausea, vomiting, diarrhea, constipation, melena or hematochezia. GU:  No urgency, frequency, dysuria, or hematuria. Musculoskeletal:  No back pain.  No joint pain.  No muscle tenderness. Extremities:  No pain or swelling. Skin:  No rashes or  skin changes. Neuro:  Residual neuropathy in fingertips. No headache, numbness or weakness, balance or coordination issues. Endocrine:  No diabetes, thyroid issues, hot flashes or night sweats. Psych:  No mood changes, depression or anxiety. Pain:  No focal pain. Review of systems:  All other systems reviewed and found to be negative.  Physical Exam: Blood pressure 132/75, pulse 77, temperature 98.6 F (37 C), resp. rate 18, weight 119 lb 6.1 oz (54.15 kg). GENERAL:  Well developed, well nourished, sitting comfortably in the exam room in no acute distress. MENTAL STATUS:  Alert and oriented to person, place and time. HEAD:  Pearline Cables styled hair.  Normocephalic,  atraumatic, face symmetric, no Cushingoid features. EYES:  Glasses.  Hazel eyes.  Pupils equal round and reactive to light and accomodation.  No conjunctivitis or scleral icterus. ENT:  Oropharynx clear without lesion.  Tongue normal. Mucous membranes moist.  RESPIRATORY:  Clear to auscultation without rales, wheezes or rhonchi. CARDIOVASCULAR:  Regular rate and rhythm without murmur, rub or gallop. BREAST:  Right breast without masses, skin changes or nipple discharge.  Left breast with post-operative changes.  No discrete masses, skin changes or nipple discharge. ABDOMEN:  Soft, non-tender, with active bowel sounds, and no hepatosplenomegaly.  No masses. SKIN:  No rashes, ulcers or lesions. EXTREMITIES: No edema, no skin discoloration or tenderness.  No palpable cords. LYMPH NODES: No palpable cervical, supraclavicular, axillary or inguinal adenopathy  NEUROLOGICAL: Unremarkable. PSYCH:  Appropriate.  Appointment on 04/23/2015  Component Date Value Ref Range Status  . WBC 04/23/2015 6.1  3.6 - 11.0 K/uL Final  . RBC 04/23/2015 4.24  3.80 - 5.20 MIL/uL Final  . Hemoglobin 04/23/2015 13.4  12.0 - 16.0 g/dL Final  . HCT 04/23/2015 38.8  35.0 - 47.0 % Final  . MCV 04/23/2015 91.4  80.0 - 100.0 fL Final  . MCH 04/23/2015 31.5  26.0 - 34.0 pg Final  . MCHC 04/23/2015 34.4  32.0 - 36.0 g/dL Final  . RDW 04/23/2015 13.0  11.5 - 14.5 % Final  . Platelets 04/23/2015 288  150 - 440 K/uL Final  . Neutrophils Relative % 04/23/2015 70   Final  . Neutro Abs 04/23/2015 4.3  1.4 - 6.5 K/uL Final  . Lymphocytes Relative 04/23/2015 17   Final  . Lymphs Abs 04/23/2015 1.0  1.0 - 3.6 K/uL Final  . Monocytes Relative 04/23/2015 11   Final  . Monocytes Absolute 04/23/2015 0.6  0.2 - 0.9 K/uL Final  . Eosinophils Relative 04/23/2015 1   Final  . Eosinophils Absolute 04/23/2015 0.0  0 - 0.7 K/uL Final  . Basophils Relative 04/23/2015 1   Final  . Basophils Absolute 04/23/2015 0.0  0 - 0.1 K/uL Final  .  Sodium 04/23/2015 125* 135 - 145 mmol/L Final  . Potassium 04/23/2015 3.8  3.5 - 5.1 mmol/L Final  . Chloride 04/23/2015 89* 101 - 111 mmol/L Final  . CO2 04/23/2015 31  22 - 32 mmol/L Final  . Glucose, Bld 04/23/2015 106* 65 - 99 mg/dL Final  . BUN 04/23/2015 8  6 - 20 mg/dL Final  . Creatinine, Ser 04/23/2015 0.65  0.44 - 1.00 mg/dL Final  . Calcium 04/23/2015 8.5* 8.9 - 10.3 mg/dL Final  . Total Protein 04/23/2015 7.2  6.5 - 8.1 g/dL Final  . Albumin 04/23/2015 4.4  3.5 - 5.0 g/dL Final  . AST 04/23/2015 38  15 - 41 U/L Final  . ALT 04/23/2015 26  14 - 54 U/L Final  . Alkaline  Phosphatase 04/23/2015 86  38 - 126 U/L Final  . Total Bilirubin 04/23/2015 0.7  0.3 - 1.2 mg/dL Final  . GFR calc non Af Amer 04/23/2015 >60  >60 mL/min Final  . GFR calc Af Amer 04/23/2015 >60  >60 mL/min Final   Comment: (NOTE) The eGFR has been calculated using the CKD EPI equation. This calculation has not been validated in all clinical situations. eGFR's persistently <60 mL/min signify possible Chronic Kidney Disease.   . Anion gap 04/23/2015 5  5 - 15 Final  . TSH 04/23/2015 2.233  0.350 - 4.500 uIU/mL Final  . Uric Acid, Serum 04/23/2015 3.2  2.3 - 6.6 mg/dL Final  . Color, Urine 04/23/2015 STRAW* YELLOW Final  . APPearance 04/23/2015 CLEAR* CLEAR Final  . Glucose, UA 04/23/2015 NEGATIVE  NEGATIVE mg/dL Final  . Bilirubin Urine 04/23/2015 NEGATIVE  NEGATIVE Final  . Ketones, ur 04/23/2015 NEGATIVE  NEGATIVE mg/dL Final  . Specific Gravity, Urine 04/23/2015 1.003* 1.005 - 1.030 Final  . Hgb urine dipstick 04/23/2015 1+* NEGATIVE Final  . pH 04/23/2015 7.0  5.0 - 8.0 Final  . Protein, ur 04/23/2015 NEGATIVE  NEGATIVE mg/dL Final  . Nitrite 04/23/2015 NEGATIVE  NEGATIVE Final  . Leukocytes, UA 04/23/2015 1+* NEGATIVE Final  . RBC / HPF 04/23/2015 0-5  0 - 5 RBC/hpf Final  . WBC, UA 04/23/2015 0-5  0 - 5 WBC/hpf Final  . Bacteria, UA 04/23/2015 RARE* NONE SEEN Final  . Squamous Epithelial / LPF  04/23/2015 0-5* NONE SEEN Final    Assessment:  Raven Barber is an 64 y.o. female woman with a history of stage I (T1bN0M0) left breast cancer status post lumpectomy and sentinel lymph node biopsy on 06/21/2013.  Pathology revealed a 7 mm grade II invasive mammary tumor.  Tumor was ER positive (>90%), PR negative (< 1%), and Her2/neu negative.  Two lymph nodes were negative.  Oncotype DX testing revealed a recurrence score of 21.  She received 4 cycles of adriamycin and Cytoxan (08/18/2013 - 10/20/2013) and 11-12 weeks of Taxol (11/10/2013 - 01/19/2014).  Her last 3 cycles of Taxol were dose reduced secondary to significant neuropathy.  She received radiation from 03/06/2014 - 04/27/2014.  Since completion of radiation, she has been on Femara.  She underwent MyRisk genetic testing.  She had no deleterious mutation, but a variance of unknown significance (VUS) in the ATM gene which conveys a high risk for breast cancer and an increased risk of pancreatic cancer.  Bilateral diagnostic mammogram on 07/05/2014 was negative.  Bone density study on 07/05/2014 revealed osteoporosis with a T-score of -2.9 in L1-L4 and T score of -2.3 in the left femoral neck.  She is on calcium and vitamin D.  Symptomatically, she is tolerating her Femara well.  She has a residual grade I/II in her feet and a grade I neuropathy in her fingertips.  Exam is stable.  She has worsening hyponatremia (Na 125).  She remains on HCTZ.  CA27.29 is slightly elevated.  Plan: 1. Labs today:  CBC with diff, CMP, CA27.29. 2. Work-up for hyponatremia:  urine sodium, osmolality, specific gravity; blood- serum osmolality, TSH, cortisol. 3. Patient to follow-up with PCP regarding chronic hyponatremia.  Brandy to contact Dr. Tonette Bihari nurse. 4. Discuss limiting free water, drinking fluids with electrolytes, and liberal use of salt. 5. Discuss plan for PET scan if CA27.29 remains elevated. 6. RTC in 1 week for BMP and Prolia. 7. RTC in 4  months for MD assessment and labs (CBC  with diff, CMP, CA27.29).   Lequita Asal, MD  04/23/2015, 2:18 PM

## 2015-04-24 ENCOUNTER — Telehealth: Payer: Self-pay

## 2015-04-24 LAB — CANCER ANTIGEN 27.29: CA 27.29: 30.8 U/mL (ref 0.0–38.6)

## 2015-04-24 NOTE — Telephone Encounter (Signed)
Spoke with Amy at PCP office.  Amy is sending a scheduling message to their schedulers regarding getting pt in to review abnormal labs and follow.

## 2015-04-24 NOTE — Telephone Encounter (Signed)
Called pt per MD to let her know that her tumor was normal.  Pt verbalized an understanding.  Also told pt that I spoken with her PCP and they were making sure to call her and have an appointment set up for her to monitor abnormal labs.

## 2015-04-30 ENCOUNTER — Other Ambulatory Visit: Payer: Self-pay

## 2015-04-30 ENCOUNTER — Inpatient Hospital Stay: Payer: PPO

## 2015-04-30 VITALS — BP 115/74 | HR 80 | Resp 20

## 2015-04-30 DIAGNOSIS — M810 Age-related osteoporosis without current pathological fracture: Secondary | ICD-10-CM

## 2015-04-30 DIAGNOSIS — C50912 Malignant neoplasm of unspecified site of left female breast: Secondary | ICD-10-CM

## 2015-04-30 LAB — BASIC METABOLIC PANEL
Anion gap: 7 (ref 5–15)
BUN: 10 mg/dL (ref 6–20)
CO2: 31 mmol/L (ref 22–32)
Calcium: 9.1 mg/dL (ref 8.9–10.3)
Chloride: 93 mmol/L — ABNORMAL LOW (ref 101–111)
Creatinine, Ser: 0.61 mg/dL (ref 0.44–1.00)
GFR calc Af Amer: >60 mL/min (ref >60)
GFR calc non Af Amer: >60 mL/min (ref >60)
Glucose, Bld: 120 mg/dL — ABNORMAL HIGH (ref 65–99)
Potassium: 3.8 mmol/L (ref 3.5–5.1)
Sodium: 131 mmol/L — ABNORMAL LOW (ref 135–145)

## 2015-04-30 LAB — CORTISOL: Cortisol, Plasma: 9.8 ug/dL

## 2015-04-30 MED ORDER — DENOSUMAB 60 MG/ML ~~LOC~~ SOLN
60.0000 mg | Freq: Once | SUBCUTANEOUS | Status: AC
  Administered 2015-04-30: 60 mg via SUBCUTANEOUS
  Filled 2015-04-30: qty 1

## 2015-05-18 ENCOUNTER — Telehealth: Payer: Self-pay | Admitting: *Deleted

## 2015-05-18 MED ORDER — GABAPENTIN 100 MG PO CAPS
100.0000 mg | ORAL_CAPSULE | Freq: Two times a day (BID) | ORAL | Status: DC
Start: 2015-05-18 — End: 2015-11-09

## 2015-05-18 NOTE — Telephone Encounter (Signed)
Escribed

## 2015-07-16 ENCOUNTER — Other Ambulatory Visit: Payer: Self-pay | Admitting: Family Medicine

## 2015-07-19 ENCOUNTER — Other Ambulatory Visit: Payer: Self-pay | Admitting: Nurse Practitioner

## 2015-11-09 ENCOUNTER — Other Ambulatory Visit: Payer: Self-pay | Admitting: *Deleted

## 2015-11-09 ENCOUNTER — Ambulatory Visit
Admission: RE | Admit: 2015-11-09 | Discharge: 2015-11-09 | Disposition: A | Discharge status: Home / Self Care | Payer: PPO | Source: Ambulatory Visit | Attending: Radiation Oncology | Admitting: Radiation Oncology

## 2015-11-09 ENCOUNTER — Encounter: Payer: Self-pay | Admitting: Radiation Oncology

## 2015-11-09 VITALS — BP 125/74 | HR 76 | Temp 98.2°F | Wt 121.6 lb

## 2015-11-09 DIAGNOSIS — Z170 Estrogen receptor positive status [ER+]: Secondary | ICD-10-CM | POA: Diagnosis not present

## 2015-11-09 DIAGNOSIS — C50312 Malignant neoplasm of lower-inner quadrant of left female breast: Secondary | ICD-10-CM | POA: Diagnosis not present

## 2015-11-09 DIAGNOSIS — C50912 Malignant neoplasm of unspecified site of left female breast: Secondary | ICD-10-CM

## 2015-11-09 NOTE — Progress Notes (Signed)
Radiation Oncology Follow up Note  Name: Raven Barber   Date:   11/09/2015 MRN:  [id number] DOB: [birth year]    This 65 y.o. female presents to the clinic today for follow-up now out a year and a half having cleated whole breast radiation to her left breast for stage I (T1 be N0 M0) ER positive PR borderline HER-2/neu negative invasive mammary carcinoma.  REFERRING PROVIDER: No ref. provider found  HPI: Patient is a 65 year old female now out a year and a half having completed whole breast radiation to her left breast for a 7 mm lower inner quadrant invasive mammary carcinoma ER positive PR borderline HER-2/neu not overexpressed.. She is currently on letrozole following that well has not had a follow-up mammogram which we will order today. She has had some problems with hyponatremia as well as continued problems with peripheral neuropathy especially in her feet. She specifically denies breast tenderness cough or bone pain.  COMPLICATIONS OF TREATMENT: none  FOLLOW UP COMPLIANCE: keeps appointments   PHYSICAL EXAM:  BP 125/74 mmHg  Pulse 76  Temp(Src) 98.2 F (36.8 C)  Wt 121 lb 9.3 oz (55.15 kg) Lungs are clear to A&P cardiac examination essentially unremarkable with regular rate and rhythm. No dominant mass or nodularity is noted in either breast in 2 positions examined. Incision is well-healed. No axillary or supraclavicular adenopathy is appreciated. Cosmetic result is excellent. Port-A-Cath has been removed. Well-developed well-nourished patient in NAD. HEENT reveals PERLA, EOMI, discs not visualized.  Oral cavity is clear. No oral mucosal lesions are identified. Neck is clear without evidence of cervical or supraclavicular adenopathy. Lungs are clear to A&P. Cardiac examination is essentially unremarkable with regular rate and rhythm without murmur rub or thrill. Abdomen is benign with no organomegaly or masses noted. Motor sensory and DTR levels are equal and symmetric in the upper and  lower extremities. Cranial nerves II through XII are grossly intact. Proprioception is intact. No peripheral adenopathy or edema is identified. No motor or sensory levels are noted. Crude visual fields are within normal range.  RADIOLOGY RESULTS: Bilateral diagnostic mammograms are ordered  PLAN: Present time patient continues to do well with no evidence of disease. She continues on letrozole without side effect. I have ordered diagnostic bilateral mammograms. I have asked to see the patient back in 6 months for follow-up. Patient knows to call with any concerns. She continues gabapentin for her peripheral neuropathy.  I would like to take this opportunity for allowing me to participate in the care of your patient.Armstead Peaks., MD

## 2015-11-26 ENCOUNTER — Ambulatory Visit
Admission: RE | Admit: 2015-11-26 | Discharge: 2015-11-26 | Disposition: A | Discharge status: Home / Self Care | Payer: PPO | Source: Ambulatory Visit | Attending: Radiation Oncology | Admitting: Radiation Oncology

## 2015-11-26 DIAGNOSIS — C50912 Malignant neoplasm of unspecified site of left female breast: Secondary | ICD-10-CM

## 2015-11-26 DIAGNOSIS — Z853 Personal history of malignant neoplasm of breast: Secondary | ICD-10-CM | POA: Diagnosis not present

## 2015-11-26 DIAGNOSIS — R928 Other abnormal and inconclusive findings on diagnostic imaging of breast: Secondary | ICD-10-CM | POA: Diagnosis not present

## 2015-12-21 DIAGNOSIS — E7800 Pure hypercholesterolemia, unspecified: Secondary | ICD-10-CM | POA: Diagnosis not present

## 2015-12-21 DIAGNOSIS — I10 Essential (primary) hypertension: Secondary | ICD-10-CM | POA: Diagnosis not present

## 2015-12-28 DIAGNOSIS — G620 Drug-induced polyneuropathy: Secondary | ICD-10-CM | POA: Diagnosis not present

## 2015-12-28 DIAGNOSIS — I10 Essential (primary) hypertension: Secondary | ICD-10-CM | POA: Diagnosis not present

## 2015-12-28 DIAGNOSIS — E7800 Pure hypercholesterolemia, unspecified: Secondary | ICD-10-CM | POA: Diagnosis not present

## 2015-12-28 DIAGNOSIS — T451X5A Adverse effect of antineoplastic and immunosuppressive drugs, initial encounter: Secondary | ICD-10-CM | POA: Diagnosis not present

## 2016-04-22 ENCOUNTER — Other Ambulatory Visit: Payer: Self-pay | Admitting: Hematology and Oncology

## 2016-05-06 DIAGNOSIS — K640 First degree hemorrhoids: Secondary | ICD-10-CM | POA: Diagnosis not present

## 2016-05-06 DIAGNOSIS — K635 Polyp of colon: Secondary | ICD-10-CM | POA: Diagnosis not present

## 2016-05-06 DIAGNOSIS — D126 Benign neoplasm of colon, unspecified: Secondary | ICD-10-CM | POA: Diagnosis not present

## 2016-05-06 DIAGNOSIS — Z86010 Personal history of colonic polyps: Secondary | ICD-10-CM | POA: Diagnosis not present

## 2016-05-06 DIAGNOSIS — K648 Other hemorrhoids: Secondary | ICD-10-CM | POA: Diagnosis not present

## 2016-06-20 DIAGNOSIS — I10 Essential (primary) hypertension: Secondary | ICD-10-CM | POA: Diagnosis not present

## 2016-06-20 DIAGNOSIS — E7800 Pure hypercholesterolemia, unspecified: Secondary | ICD-10-CM | POA: Diagnosis not present

## 2016-07-02 DIAGNOSIS — Z0000 Encounter for general adult medical examination without abnormal findings: Secondary | ICD-10-CM | POA: Diagnosis not present

## 2016-07-02 DIAGNOSIS — G620 Drug-induced polyneuropathy: Secondary | ICD-10-CM | POA: Diagnosis not present

## 2016-07-02 DIAGNOSIS — I10 Essential (primary) hypertension: Secondary | ICD-10-CM | POA: Diagnosis not present

## 2016-07-02 DIAGNOSIS — T451X5A Adverse effect of antineoplastic and immunosuppressive drugs, initial encounter: Secondary | ICD-10-CM | POA: Diagnosis not present

## 2016-07-02 DIAGNOSIS — M159 Polyosteoarthritis, unspecified: Secondary | ICD-10-CM | POA: Diagnosis not present

## 2016-07-02 DIAGNOSIS — E7800 Pure hypercholesterolemia, unspecified: Secondary | ICD-10-CM | POA: Diagnosis not present

## 2016-07-09 ENCOUNTER — Other Ambulatory Visit: Payer: Self-pay | Admitting: *Deleted

## 2016-07-10 ENCOUNTER — Other Ambulatory Visit: Payer: Self-pay | Admitting: *Deleted

## 2016-07-10 MED ORDER — LETROZOLE 2.5 MG PO TABS: 2.5000 mg | ORAL_TABLET | Freq: Every day | ORAL | 0 refills | Status: DC

## 2016-07-10 NOTE — Telephone Encounter (Signed)
Called to request refill on letrozole, she has not been seen in over a year and was supposed to return in February 2017. Looks like appt never got scheduled. Please advise, should I refill for 30 days and have her see you in that time frame?

## 2016-07-10 NOTE — Telephone Encounter (Signed)
Appt made for 07/22/16 and 30 day rx sent to pharmacy

## 2016-07-18 ENCOUNTER — Other Ambulatory Visit: Payer: Self-pay | Admitting: *Deleted

## 2016-07-18 DIAGNOSIS — Z853 Personal history of malignant neoplasm of breast: Secondary | ICD-10-CM

## 2016-07-20 ENCOUNTER — Other Ambulatory Visit: Payer: Self-pay | Admitting: Hematology and Oncology

## 2016-07-20 NOTE — Progress Notes (Signed)
[facility] day:  07/22/16  Chief Complaint: Raven Barber is an 65 y.o. female with a history of stage I left breast cancer who is seen for 14 month reassessment.  HPI: The patient was last seen in the medical oncology clinic on 04/23/2015.  At that time, she was tolerating her Femara well.  She had a residual grade I/II in her feet and a grade I neuropathy in her fingertips.  Exam was stable.  She had worsening hyponatremia (Na 125) on HCTZ.  CA27.29 was normal (30.8).    She was to have followed up with Dr. Ouida Sills regarding her hyponatremia felt secondary to HCTZ.  She received Prolia on 04/30/2015.  She was to have returned for follow-up in 4 months.  She has lost to follow-up.    She saw Dr. Baruch Gouty on 11/09/2015.  Mammogram was ordered.  Bilateral mammogram on 11/26/2015 revealed no evidence of malignancy.  During the interim, she has been doing well.  She is off her HCTZ.  She is working out on a treadmill 3 days/week.  She denies any breast concerns.   Past Medical History:  Diagnosis Date  . Breast cancer ([place])   . Breast cancer, left breast ([place])   . History of bone density study 06/2014  . History of colonoscopy 2012  . History of mammogram 06/2014  . History of Papanicolaou smear of cervix 2013  . Hypercholesteremia   . Hypertension     Past Surgical History:  Procedure Laterality Date  . BREAST LUMPECTOMY  06/21/2013  . PORT-A-CATH REMOVAL  11/15/2014  . PORTACATH PLACEMENT    . TUBAL LIGATION     Family History  Problem Relation Age of Onset  . Breast cancer Cousin 6  . Cancer Paternal Uncle     lung  . Breast cancer Paternal Aunt 13   Social History:  reports that she has never smoked. She has never used smokeless tobacco. She reports that she does not drink alcohol or use drugs.  She lives in [place].  She works out on a treadmill 3 days/week.  The patient is alone today.  Allergies:  Allergies  Allergen  Reactions  . Lisinopril     Other reaction(s): Cough   Current Medications: Current Outpatient Prescriptions  Medication Sig Dispense Refill  . amLODipine (NORVASC) 5 MG tablet Take 5 mg by mouth daily.  0  . Calcium 600-200 MG-UNIT per tablet Take 2 tablets by mouth daily.    Marland Kitchen gabapentin (NEURONTIN) 100 MG capsule Take by mouth.    . letrozole (FEMARA) 2.5 MG tablet Take 1 tablet (2.5 mg total) by mouth daily. 90 tablet 3  . Omega-3 Fatty Acids (FISH OIL) 1000 MG CAPS Take 1 capsule by mouth daily.    . pravastatin (PRAVACHOL) 40 MG tablet Take 40 mg by mouth daily.  0  . hydrochlorothiazide (MICROZIDE) 12.5 MG capsule Take 12.5 mg by mouth daily. Reported on 11/09/2015  0   No current facility-administered medications for this visit.    Review of Systems:  GENERAL:  Feels good.  No fevers or sweats.  Weight gain of 4 pounds since last visit. PERFORMANCE STATUS (ECOG):  1 HEENT:  No visual changes, runny nose, sore throat, mouth sores or tenderness.  No dental issues. Lungs: No shortness of breath or cough.  No hemoptysis. Cardiac:  No chest pain, palpitations, orthopnea, or PND. GI:  No nausea, vomiting, diarrhea, constipation, melena or hematochezia. GU:  No urgency,  frequency, dysuria, or hematuria. Musculoskeletal:  No back pain.  No joint pain.  No muscle tenderness. Extremities:  No pain or swelling. Skin:  No rashes or skin changes. Neuro:  Neuropathy in fingertip tingling (slower buttoning things).  No headache, numbness or weakness, balance or coordination issues. Endocrine:  No diabetes, thyroid issues, hot flashes or night sweats. Psych:  No mood changes, depression or anxiety. Pain:  No focal pain. Review of systems:  All other systems reviewed and found to be negative.  Physical Exam: Blood pressure 129/69, pulse 75, temperature (!) 96.7 F (35.9 C), temperature source Tympanic, resp. rate 18, weight 123 lb 0.3 oz (55.8 kg). GENERAL:  Well developed, well nourished,  woman sitting comfortably in the exam room in no acute distress. MENTAL STATUS:  Alert and oriented to person, place and time. HEAD:  Long gray hair.  Normocephalic, atraumatic, face symmetric, no Cushingoid features. EYES:  Glasses.  Hazel eyes.  Pupils equal round and reactive to light and accomodation.  No conjunctivitis or scleral icterus. ENT:  Oropharynx clear without lesion.  Tongue normal. Mucous membranes moist.  RESPIRATORY:  Clear to auscultation without rales, wheezes or rhonchi. CARDIOVASCULAR:  Regular rate and rhythm without murmur, rub or gallop. BREAST:  Right breast without masses, skin changes or nipple discharge.  Left breast with post-operative changes.  No discrete masses, skin changes or nipple discharge. ABDOMEN:  Soft, non-tender, with active bowel sounds, and no hepatosplenomegaly.  No masses. SKIN:  No rashes, ulcers or lesions. EXTREMITIES: No edema, no skin discoloration or tenderness.  No palpable cords. LYMPH NODES: No palpable cervical, supraclavicular, axillary or inguinal adenopathy  NEUROLOGICAL: Unremarkable. PSYCH:  Appropriate.   Orders Only on 07/22/2016  Component Date Value Ref Range Status  . WBC 07/22/2016 5.3  3.6 - 11.0 K/uL Final  . RBC 07/22/2016 4.38  3.80 - 5.20 MIL/uL Final  . Hemoglobin 07/22/2016 13.9  12.0 - 16.0 g/dL Final  . HCT 07/22/2016 39.8  35.0 - 47.0 % Final  . MCV 07/22/2016 90.9  80.0 - 100.0 fL Final  . MCH 07/22/2016 31.7  26.0 - 34.0 pg Final  . MCHC 07/22/2016 34.8  32.0 - 36.0 g/dL Final  . RDW 07/22/2016 13.3  11.5 - 14.5 % Final  . Platelets 07/22/2016 282  150 - 440 K/uL Final  . Neutrophils Relative % 07/22/2016 71  % Final  . Neutro Abs 07/22/2016 3.8  1.4 - 6.5 K/uL Final  . Lymphocytes Relative 07/22/2016 18  % Final  . Lymphs Abs 07/22/2016 0.9* 1.0 - 3.6 K/uL Final  . Monocytes Relative 07/22/2016 9  % Final  . Monocytes Absolute 07/22/2016 0.5  0.2 - 0.9 K/uL Final  . Eosinophils Relative 07/22/2016 1  %  Final  . Eosinophils Absolute 07/22/2016 0.0  0 - 0.7 K/uL Final  . Basophils Relative 07/22/2016 1  % Final  . Basophils Absolute 07/22/2016 0.0  0 - 0.1 K/uL Final  . Sodium 07/22/2016 135  135 - 145 mmol/L Final  . Potassium 07/22/2016 4.2  3.5 - 5.1 mmol/L Final  . Chloride 07/22/2016 97* 101 - 111 mmol/L Final  . CO2 07/22/2016 30  22 - 32 mmol/L Final  . Glucose, Bld 07/22/2016 99  65 - 99 mg/dL Final  . BUN 07/22/2016 8  6 - 20 mg/dL Final  . Creatinine, Ser 07/22/2016 0.63  0.44 - 1.00 mg/dL Final  . Calcium 07/22/2016 9.8  8.9 - 10.3 mg/dL Final  . Total Protein 07/22/2016 7.4  6.5 - 8.1 g/dL Final  . Albumin 07/22/2016 4.5  3.5 - 5.0 g/dL Final  . AST 07/22/2016 36  15 - 41 U/L Final  . ALT 07/22/2016 32  14 - 54 U/L Final  . Alkaline Phosphatase 07/22/2016 93  38 - 126 U/L Final  . Total Bilirubin 07/22/2016 0.6  0.3 - 1.2 mg/dL Final  . GFR calc non Af Amer 07/22/2016 >60  >60 mL/min Final  . GFR calc Af Amer 07/22/2016 >60  >60 mL/min Final   Comment: (NOTE) The eGFR has been calculated using the CKD EPI equation. This calculation has not been validated in all clinical situations. eGFR's persistently <60 mL/min signify possible Chronic Kidney Disease.   . Anion gap 07/22/2016 8  5 - 15 Final    Assessment:  Natasja Niday Leija is an 65 y.o. female woman with a history of stage I left breast cancer status post lumpectomy and sentinel lymph node biopsy on 06/21/2013.  Pathology revealed a 7 mm grade II invasive mammary tumor.  Tumor was ER positive (>90%), PR negative (< 1%), and Her2/neu negative.  Two lymph nodes were negative.  Pathologic stage was T1bN0M0.  Oncotype DX testing revealed a recurrence score of 21.  She received 4 cycles of Adriamycin and Cytoxan (08/18/2013 - 10/20/2013) and 11-12 weeks of Taxol (11/10/2013 - 01/19/2014).  Her last 3 cycles of Taxol were dose reduced secondary to significant neuropathy.  She received radiation from 03/06/2014 - 04/27/2014.  Since  completion of radiation, she has been on Femara.  She underwent MyRisk genetic testing.  She had no deleterious mutation, but a variance of unknown significance (VUS) in the ATM gene which conveys a high risk for breast cancer and an increased risk of pancreatic cancer.  Bilateral diagnostic mammogram on 11/26/2015 revealed no evidence of malignancy.  CA27.29 has been followed: 46.1 on 12/11/2014, 42 on 01/01/2015, 30.8 on 04/23/2015, and 39.2 on 07/22/2016.  Bone density study on 07/05/2014 revealed osteoporosis with a T-score of -2.9 in L1-L4 and T score of -2.3 in the left femoral neck.  She is on calcium and vitamin D.  She receives Prolia every 6 months (last 04/30/2015).  Symptomatically, she is tolerating her Femara well.  She has a residual grade I/II in her feet and a grade I neuropathy in her fingertips.  Exam is stable.  Sodium is normal off of HCTZ.  Plan: 1.  Labs today:  CBC with diff, CMP, CA27.29. 2.  Review interval mammogram. 3.  Discuss need for bone density study every 2 years (due 07/05/2016).  Discuss Prolia. 4.  Rx: Femara. 5.  Schedule bone density study. 6.  Schedule mammogram in 11/2016. 7.  Preauth Prolia. 8.  RTC after bone density and preauth for MD review, labs (BMP) and Prolia. 9.  RTC 6 months after above appt for MD assessment, labs (CBC with diff, CMP, CA27.29), review of mammogram, and Prolia.   Lequita Asal, MD  07/22/2016, 9:30 AM

## 2016-07-22 ENCOUNTER — Inpatient Hospital Stay: Payer: PPO | Attending: Hematology and Oncology | Admitting: Hematology and Oncology

## 2016-07-22 ENCOUNTER — Other Ambulatory Visit: Payer: Self-pay

## 2016-07-22 ENCOUNTER — Encounter: Payer: Self-pay | Admitting: Hematology and Oncology

## 2016-07-22 ENCOUNTER — Inpatient Hospital Stay: Payer: PPO

## 2016-07-22 VITALS — BP 129/69 | HR 75 | Temp 96.7°F | Resp 18 | Wt 123.0 lb

## 2016-07-22 DIAGNOSIS — I10 Essential (primary) hypertension: Secondary | ICD-10-CM

## 2016-07-22 DIAGNOSIS — E871 Hypo-osmolality and hyponatremia: Secondary | ICD-10-CM | POA: Diagnosis not present

## 2016-07-22 DIAGNOSIS — G629 Polyneuropathy, unspecified: Secondary | ICD-10-CM | POA: Diagnosis not present

## 2016-07-22 DIAGNOSIS — M810 Age-related osteoporosis without current pathological fracture: Secondary | ICD-10-CM

## 2016-07-22 DIAGNOSIS — Z79811 Long term (current) use of aromatase inhibitors: Secondary | ICD-10-CM | POA: Diagnosis not present

## 2016-07-22 DIAGNOSIS — Z803 Family history of malignant neoplasm of breast: Secondary | ICD-10-CM | POA: Diagnosis not present

## 2016-07-22 DIAGNOSIS — E7800 Pure hypercholesterolemia, unspecified: Secondary | ICD-10-CM | POA: Insufficient documentation

## 2016-07-22 DIAGNOSIS — C50412 Malignant neoplasm of upper-outer quadrant of left female breast: Secondary | ICD-10-CM | POA: Insufficient documentation

## 2016-07-22 DIAGNOSIS — Z801 Family history of malignant neoplasm of trachea, bronchus and lung: Secondary | ICD-10-CM | POA: Diagnosis not present

## 2016-07-22 DIAGNOSIS — Z170 Estrogen receptor positive status [ER+]: Principal | ICD-10-CM

## 2016-07-22 DIAGNOSIS — Z79899 Other long term (current) drug therapy: Secondary | ICD-10-CM | POA: Diagnosis not present

## 2016-07-22 DIAGNOSIS — M8580 Other specified disorders of bone density and structure, unspecified site: Secondary | ICD-10-CM | POA: Diagnosis not present

## 2016-07-22 DIAGNOSIS — M818 Other osteoporosis without current pathological fracture: Secondary | ICD-10-CM

## 2016-07-22 LAB — COMPREHENSIVE METABOLIC PANEL
ALT: 32 U/L (ref 14–54)
AST: 36 U/L (ref 15–41)
Albumin: 4.5 g/dL (ref 3.5–5.0)
Alkaline Phosphatase: 93 U/L (ref 38–126)
Anion gap: 8 (ref 5–15)
BUN: 8 mg/dL (ref 6–20)
CO2: 30 mmol/L (ref 22–32)
Calcium: 9.8 mg/dL (ref 8.9–10.3)
Chloride: 97 mmol/L — ABNORMAL LOW (ref 101–111)
Creatinine, Ser: 0.63 mg/dL (ref 0.44–1.00)
GFR calc Af Amer: >60 mL/min (ref >60)
GFR calc non Af Amer: >60 mL/min (ref >60)
Glucose, Bld: 99 mg/dL (ref 65–99)
Potassium: 4.2 mmol/L (ref 3.5–5.1)
Sodium: 135 mmol/L (ref 135–145)
Total Bilirubin: 0.6 mg/dL (ref 0.3–1.2)
Total Protein: 7.4 g/dL (ref 6.5–8.1)

## 2016-07-22 LAB — CBC WITH DIFFERENTIAL/PLATELET
Basophils Absolute: 0 10*3/uL (ref 0–0.1)
Basophils Relative: 1 %
Eosinophils Absolute: 0 10*3/uL (ref 0–0.7)
Eosinophils Relative: 1 %
HCT: 39.8 % (ref 35.0–47.0)
Hemoglobin: 13.9 g/dL (ref 12.0–16.0)
Lymphocytes Relative: 18 %
Lymphs Abs: 0.9 10*3/uL — ABNORMAL LOW (ref 1.0–3.6)
MCH: 31.7 pg (ref 26.0–34.0)
MCHC: 34.8 g/dL (ref 32.0–36.0)
MCV: 90.9 fL (ref 80.0–100.0)
Monocytes Absolute: 0.5 10*3/uL (ref 0.2–0.9)
Monocytes Relative: 9 %
Neutro Abs: 3.8 10*3/uL (ref 1.4–6.5)
Neutrophils Relative %: 71 %
Platelets: 282 10*3/uL (ref 150–440)
RBC: 4.38 MIL/uL (ref 3.80–5.20)
RDW: 13.3 % (ref 11.5–14.5)
WBC: 5.3 10*3/uL (ref 3.6–11.0)

## 2016-07-22 MED ORDER — LETROZOLE 2.5 MG PO TABS: 2.5000 mg | ORAL_TABLET | Freq: Every day | ORAL | 3 refills | Status: DC

## 2016-07-22 NOTE — Progress Notes (Signed)
Patient saw her PCP in December.  Stopped HCTZ.  Decreased Amlodipine to 2.5 mg daily and increased Gabapentin 100 mg to 3 times daily.  Patient offers no complaints today.

## 2016-07-23 ENCOUNTER — Telehealth: Payer: Self-pay | Admitting: *Deleted

## 2016-07-23 DIAGNOSIS — C50412 Malignant neoplasm of upper-outer quadrant of left female breast: Secondary | ICD-10-CM

## 2016-07-23 DIAGNOSIS — Z170 Estrogen receptor positive status [ER+]: Principal | ICD-10-CM

## 2016-07-23 LAB — CANCER ANTIGEN 27.29: CA 27.29: 39.2 U/mL — ABNORMAL HIGH (ref 0.0–38.6)

## 2016-07-23 NOTE — Telephone Encounter (Signed)
Patient notified of increased ca 27-29 and redraw of the lab for 08-15-16. Voiced understanding.

## 2016-08-12 ENCOUNTER — Ambulatory Visit
Admission: RE | Admit: 2016-08-12 | Discharge: 2016-08-12 | Disposition: A | Discharge status: Home / Self Care | Payer: PPO | Source: Ambulatory Visit | Attending: Hematology and Oncology | Admitting: Hematology and Oncology

## 2016-08-12 DIAGNOSIS — M818 Other osteoporosis without current pathological fracture: Secondary | ICD-10-CM | POA: Diagnosis not present

## 2016-08-12 DIAGNOSIS — G629 Polyneuropathy, unspecified: Secondary | ICD-10-CM | POA: Insufficient documentation

## 2016-08-12 DIAGNOSIS — M810 Age-related osteoporosis without current pathological fracture: Secondary | ICD-10-CM | POA: Insufficient documentation

## 2016-08-12 DIAGNOSIS — Z9221 Personal history of antineoplastic chemotherapy: Secondary | ICD-10-CM | POA: Diagnosis not present

## 2016-08-12 DIAGNOSIS — Z170 Estrogen receptor positive status [ER+]: Secondary | ICD-10-CM | POA: Diagnosis not present

## 2016-08-12 DIAGNOSIS — Z923 Personal history of irradiation: Secondary | ICD-10-CM | POA: Insufficient documentation

## 2016-08-12 DIAGNOSIS — Z780 Asymptomatic menopausal state: Secondary | ICD-10-CM | POA: Insufficient documentation

## 2016-08-12 DIAGNOSIS — C50412 Malignant neoplasm of upper-outer quadrant of left female breast: Secondary | ICD-10-CM | POA: Diagnosis not present

## 2016-08-15 ENCOUNTER — Other Ambulatory Visit: Payer: PPO

## 2016-08-15 ENCOUNTER — Ambulatory Visit: Payer: PPO | Admitting: Hematology and Oncology

## 2016-08-15 ENCOUNTER — Ambulatory Visit: Payer: PPO

## 2016-08-18 ENCOUNTER — Other Ambulatory Visit: Payer: Self-pay | Admitting: Hematology and Oncology

## 2016-08-19 ENCOUNTER — Encounter: Payer: Self-pay | Admitting: Hematology and Oncology

## 2016-08-19 ENCOUNTER — Inpatient Hospital Stay (HOSPITAL_BASED_OUTPATIENT_CLINIC_OR_DEPARTMENT_OTHER): Payer: PPO | Admitting: Hematology and Oncology

## 2016-08-19 ENCOUNTER — Inpatient Hospital Stay: Payer: PPO

## 2016-08-19 VITALS — BP 126/79 | HR 73 | Temp 96.2°F | Resp 18 | Wt 122.6 lb

## 2016-08-19 DIAGNOSIS — M818 Other osteoporosis without current pathological fracture: Secondary | ICD-10-CM

## 2016-08-19 DIAGNOSIS — Z170 Estrogen receptor positive status [ER+]: Principal | ICD-10-CM

## 2016-08-19 DIAGNOSIS — Z801 Family history of malignant neoplasm of trachea, bronchus and lung: Secondary | ICD-10-CM | POA: Diagnosis not present

## 2016-08-19 DIAGNOSIS — M810 Age-related osteoporosis without current pathological fracture: Secondary | ICD-10-CM

## 2016-08-19 DIAGNOSIS — I10 Essential (primary) hypertension: Secondary | ICD-10-CM | POA: Diagnosis not present

## 2016-08-19 DIAGNOSIS — Z79811 Long term (current) use of aromatase inhibitors: Secondary | ICD-10-CM | POA: Diagnosis not present

## 2016-08-19 DIAGNOSIS — C50412 Malignant neoplasm of upper-outer quadrant of left female breast: Secondary | ICD-10-CM | POA: Diagnosis not present

## 2016-08-19 DIAGNOSIS — E871 Hypo-osmolality and hyponatremia: Secondary | ICD-10-CM | POA: Diagnosis not present

## 2016-08-19 DIAGNOSIS — Z79899 Other long term (current) drug therapy: Secondary | ICD-10-CM

## 2016-08-19 DIAGNOSIS — E7800 Pure hypercholesterolemia, unspecified: Secondary | ICD-10-CM | POA: Diagnosis not present

## 2016-08-19 DIAGNOSIS — Z803 Family history of malignant neoplasm of breast: Secondary | ICD-10-CM

## 2016-08-19 DIAGNOSIS — G629 Polyneuropathy, unspecified: Secondary | ICD-10-CM

## 2016-08-19 DIAGNOSIS — M8580 Other specified disorders of bone density and structure, unspecified site: Secondary | ICD-10-CM

## 2016-08-19 LAB — BASIC METABOLIC PANEL
Anion gap: 7 (ref 5–15)
BUN: 10 mg/dL (ref 6–20)
CO2: 30 mmol/L (ref 22–32)
Calcium: 9.8 mg/dL (ref 8.9–10.3)
Chloride: 98 mmol/L — ABNORMAL LOW (ref 101–111)
Creatinine, Ser: 0.69 mg/dL (ref 0.44–1.00)
GFR calc Af Amer: >60 mL/min (ref >60)
GFR calc non Af Amer: >60 mL/min (ref >60)
Glucose, Bld: 128 mg/dL — ABNORMAL HIGH (ref 65–99)
Potassium: 3.9 mmol/L (ref 3.5–5.1)
Sodium: 135 mmol/L (ref 135–145)

## 2016-08-19 MED ORDER — DENOSUMAB 60 MG/ML ~~LOC~~ SOLN
60.0000 mg | Freq: Once | SUBCUTANEOUS | Status: AC
Start: 2016-08-19 — End: 2016-08-19
  Administered 2016-08-19: 60 mg via SUBCUTANEOUS
  Filled 2016-08-19: qty 1

## 2016-08-19 NOTE — Progress Notes (Signed)
[facility] day:  08/19/16  Chief Complaint: Raven Barber is an 66 y.o. female with a history of stage I left breast cancer who is seen for 1 month assessment prior to every 6 month Prolia.  HPI: The patient was last seen in the medical oncology clinic on 07/22/2016.  At that time, she was doing well.  She was active working out on the treadmill 3 days/week.  She was taking calcium and vitamin D.  Exam was unremarkable.  Sodium was normal off HCTZ.  CA27.29 was 39.2.  Bone density on 08/12/2016 revealed osteopenia with a T score of -2.5 in AP spine L1-L4 and -2.1 in the left femoral neck.  Symptomatically, she notes a neuropathy in the balls of her feet.  She denies any other symptoms.   Past Medical History:  Diagnosis Date  . Breast cancer ([place])   . Breast cancer, left breast ([place])   . History of bone density study 06/2014  . History of colonoscopy 2012  . History of mammogram 06/2014  . History of Papanicolaou smear of cervix 2013  . Hypercholesteremia   . Hypertension     Past Surgical History:  Procedure Laterality Date  . BREAST LUMPECTOMY  06/21/2013  . PORT-A-CATH REMOVAL  11/15/2014  . PORTACATH PLACEMENT    . TUBAL LIGATION     Family History  Problem Relation Age of Onset  . Breast cancer Cousin 63  . Cancer Paternal Uncle     lung  . Breast cancer Paternal Aunt 35   Social History:  reports that she has never smoked. She has never used smokeless tobacco. She reports that she does not drink alcohol or use drugs.  She lives in [place].  She works out on a treadmill 3 days/week.  The patient is accompanied by her husband today.  Allergies:  Allergies  Allergen Reactions  . Lisinopril     Other reaction(s): Cough   Current Medications: Current Outpatient Prescriptions  Medication Sig Dispense Refill  . amLODipine (NORVASC) 5 MG tablet Take 5 mg by mouth daily.  0  . Calcium 600-200 MG-UNIT per tablet Take 2  tablets by mouth daily.    Marland Kitchen gabapentin (NEURONTIN) 100 MG capsule Take by mouth.    . hydrochlorothiazide (MICROZIDE) 12.5 MG capsule Take 12.5 mg by mouth daily. Reported on 11/09/2015  0  . letrozole (FEMARA) 2.5 MG tablet Take 1 tablet (2.5 mg total) by mouth daily. 90 tablet 3  . Omega-3 Fatty Acids (FISH OIL) 1000 MG CAPS Take 1 capsule by mouth daily.    . pravastatin (PRAVACHOL) 40 MG tablet Take 40 mg by mouth daily.  0   No current facility-administered medications for this visit.    Review of Systems:  GENERAL:  Feels good.  No fevers or sweats.  Weight down 1 pound since last visit. PERFORMANCE STATUS (ECOG):  1 HEENT:  No visual changes, runny nose, sore throat, mouth sores or tenderness.  No dental issues. Lungs: No shortness of breath or cough.  No hemoptysis. Cardiac:  No chest pain, palpitations, orthopnea, or PND. GI:  No nausea, vomiting, diarrhea, constipation, melena or hematochezia. GU:  No urgency, frequency, dysuria, or hematuria. Musculoskeletal:  No back pain.  No joint pain.  No muscle tenderness. Extremities:  No pain or swelling. Skin:  No rashes or skin changes. Neuro:  Neuropathy in fingertip tingling (slower buttoning things) and balls of her feet.  No headache, numbness or weakness, balance  or coordination issues. Endocrine:  No diabetes, thyroid issues, hot flashes or night sweats. Psych:  No mood changes, depression or anxiety. Pain:  No focal pain. Review of systems:  All other systems reviewed and found to be negative.  Physical Exam: Blood pressure 126/79, pulse 73, temperature (!) 96.2 F (35.7 C), resp. rate 18, weight 122 lb 9.2 oz (55.6 kg). GENERAL:  Well developed, well nourished, woman sitting comfortably in the exam room in no acute distress. MENTAL STATUS:  Alert and oriented to person, place and time. HEAD:  Long gray hair.  Normocephalic, atraumatic, face symmetric, no Cushingoid features. EYES:  Glasses.  Hazel eyes.  No conjunctivitis  or scleral icterus. RESPIRATORY:  Clear to auscultation without rales, wheezes or rhonchi. CARDIOVASCULAR:  Regular rate and rhythm without murmur, rub or gallop. NEUROLOGICAL: Unremarkable. PSYCH:  Appropriate.   Appointment on 08/19/2016  Component Date Value Ref Range Status  . Sodium 08/19/2016 135  135 - 145 mmol/L Final  . Potassium 08/19/2016 3.9  3.5 - 5.1 mmol/L Final  . Chloride 08/19/2016 98* 101 - 111 mmol/L Final  . CO2 08/19/2016 30  22 - 32 mmol/L Final  . Glucose, Bld 08/19/2016 128* 65 - 99 mg/dL Final  . BUN 08/19/2016 10  6 - 20 mg/dL Final  . Creatinine, Ser 08/19/2016 0.69  0.44 - 1.00 mg/dL Final  . Calcium 08/19/2016 9.8  8.9 - 10.3 mg/dL Final  . GFR calc non Af Amer 08/19/2016 >60  >60 mL/min Final  . GFR calc Af Amer 08/19/2016 >60  >60 mL/min Final   Comment: (NOTE) The eGFR has been calculated using the CKD EPI equation. This calculation has not been validated in all clinical situations. eGFR's persistently <60 mL/min signify possible Chronic Kidney Disease.   . Anion gap 08/19/2016 7  5 - 15 Final    Assessment:  Raven Barber is an 66 y.o. female woman with a history of stage I left breast cancer status post lumpectomy and sentinel lymph node biopsy on 06/21/2013.  Pathology revealed a 7 mm grade II invasive mammary tumor.  Tumor was ER positive (>90%), PR negative (< 1%), and Her2/neu negative.  Two lymph nodes were negative.  Pathologic stage was T1bN0M0.  Oncotype DX testing revealed a recurrence score of 21.  She received 4 cycles of Adriamycin and Cytoxan (08/18/2013 - 10/20/2013) and 11-12 weeks of Taxol (11/10/2013 - 01/19/2014).  Her last 3 cycles of Taxol were dose reduced secondary to significant neuropathy.  She received radiation from 03/06/2014 - 04/27/2014.  Since completion of radiation, she has been on Femara.  She underwent MyRisk genetic testing.  She had no deleterious mutation, but a variance of unknown significance (VUS) in the ATM  gene which conveys a high risk for breast cancer and an increased risk of pancreatic cancer.  Bilateral diagnostic mammogram on 11/26/2015 revealed no evidence of malignancy.  CA27.29 has been followed: 46.1 on 12/11/2014, 42 on 01/01/2015, 30.8 on 04/23/2015, 39.2 on 07/22/2016, and 36.6 on 08/19/2016.  Bone density study on 07/05/2014 revealed osteoporosis with a T-score of -2.9 in L1-L4 and T score of -2.3 in the left femoral neck.  Bone density on 08/12/2016 revealed osteopenia with a T score of -2.5 in AP spine L1-L4 and -2.1 in the left femoral neck.  She is on calcium and vitamin D.  She receives Prolia every 6 months (last 04/30/2015).  Symptomatically, she is tolerating her Femara well.  She has a residual grade I/II in her feet and  a grade I neuropathy in her fingertips.  Exam is stable.  Sodium is normal off of HCTZ.  Plan: 1.  Labs today:  BMP, CA27.29. 2.  Discuss slightly increase CA27.29 at last visit.  Repeat today.  Review h/o transient elevation in past. 3.  Review interval bone density study. 4.  Continue calcium and vitamin D.  Continue Prolia every 6 months. 5.  Prolia today. 6.  Continue Femara. 7.  Next mammogram scheduled for 11/2016. 8.  RTC 6 months for MD assessment, labs (CBC with diff, CMP, CA27.29), review of mammogram, and Prolia.   Lequita Asal, MD  08/19/2016, 1:53 PM

## 2016-08-19 NOTE — Progress Notes (Signed)
Patient here today for bone density results.

## 2016-08-20 ENCOUNTER — Telehealth: Payer: Self-pay | Admitting: *Deleted

## 2016-08-20 LAB — CANCER ANTIGEN 27.29: CA 27.29: 36.6 U/mL (ref 0.0–38.6)

## 2016-08-20 NOTE — Telephone Encounter (Signed)
-----   Message from Lequita Asal, MD sent at 08/20/2016  9:03 AM EST ----- Regarding: Please call patient  Tumor marker back to normal.  M  ----- Message ----- From: Interface, Lab In [facility] Sent: 08/19/2016   1:20 PM To: Lequita Asal, MD

## 2016-08-20 NOTE — Telephone Encounter (Signed)
Called patient and LVM that tumor markers are normal.

## 2016-10-07 DIAGNOSIS — H43811 Vitreous degeneration, right eye: Secondary | ICD-10-CM | POA: Diagnosis not present

## 2016-10-17 ENCOUNTER — Encounter: Payer: Self-pay | Admitting: *Deleted

## 2016-11-12 DIAGNOSIS — H43811 Vitreous degeneration, right eye: Secondary | ICD-10-CM | POA: Diagnosis not present

## 2016-11-21 ENCOUNTER — Ambulatory Visit: Payer: PPO | Admitting: Radiation Oncology

## 2016-11-25 ENCOUNTER — Ambulatory Visit
Admission: RE | Admit: 2016-11-25 | Discharge: 2016-11-25 | Disposition: A | Discharge status: Home / Self Care | Payer: PPO | Source: Ambulatory Visit | Attending: Radiation Oncology | Admitting: Radiation Oncology

## 2016-11-25 ENCOUNTER — Encounter: Payer: Self-pay | Admitting: Radiation Oncology

## 2016-11-25 VITALS — BP 161/81 | HR 85 | Temp 97.2°F | Resp 20 | Wt 123.3 lb

## 2016-11-25 DIAGNOSIS — Z923 Personal history of irradiation: Secondary | ICD-10-CM | POA: Diagnosis not present

## 2016-11-25 DIAGNOSIS — G629 Polyneuropathy, unspecified: Secondary | ICD-10-CM | POA: Diagnosis not present

## 2016-11-25 DIAGNOSIS — Z79811 Long term (current) use of aromatase inhibitors: Secondary | ICD-10-CM | POA: Insufficient documentation

## 2016-11-25 DIAGNOSIS — C50412 Malignant neoplasm of upper-outer quadrant of left female breast: Secondary | ICD-10-CM | POA: Diagnosis not present

## 2016-11-25 DIAGNOSIS — Z170 Estrogen receptor positive status [ER+]: Secondary | ICD-10-CM | POA: Diagnosis not present

## 2016-11-25 NOTE — Progress Notes (Signed)
Radiation Oncology Follow up Note  Name: Raven Barber   Date:   11/25/2016 MRN:  [id number] DOB: Jan 03, 1951    This 66 y.o. female presents to the clinic today for 2-1/2 year follow-up status post whole breast radiation for stage I invasive mammary carcinoma ER/PR positive.  REFERRING PROVIDER: Kirk Ruths, MD  HPI: Patient is a 66 year old female now out 2-1/2 years having completed whole breast radiation to her left breast for stage I ER/PR positive invasive mammary carcinoma. Seen today in routine follow-up she is doing well.. Her last mammogram was 1 year prior BI-RADS 2 benign she scheduled for another mammogram this month. She is currently on Femara tolerating that well without side effect. She also takes gabapentin for peripheral neuropathy. She specifically denies breast tenderness cough or bone pain.  COMPLICATIONS OF TREATMENT: none  FOLLOW UP COMPLIANCE: keeps appointments   PHYSICAL EXAM:  BP (!) 161/81   Pulse 85   Temp 97.2 F (36.2 C)   Resp 20   Wt 123 lb 5.6 oz (56 kg)   BMI 23.35 kg/m  Lungs are clear to A&P cardiac examination essentially unremarkable with regular rate and rhythm. No dominant mass or nodularity is noted in either breast in 2 positions examined. Incision is well-healed. No axillary or supraclavicular adenopathy is appreciated. Cosmetic result is excellent. Well-developed well-nourished patient in NAD. HEENT reveals PERLA, EOMI, discs not visualized.  Oral cavity is clear. No oral mucosal lesions are identified. Neck is clear without evidence of cervical or supraclavicular adenopathy. Lungs are clear to A&P. Cardiac examination is essentially unremarkable with regular rate and rhythm without murmur rub or thrill. Abdomen is benign with no organomegaly or masses noted. Motor sensory and DTR levels are equal and symmetric in the upper and lower extremities. Cranial nerves II through XII are grossly intact. Proprioception is intact. No peripheral  adenopathy or edema is identified. No motor or sensory levels are noted. Crude visual fields are within normal range.  RADIOLOGY RESULTS: Mammograms reviewed and compatible with the above-stated findings  PLAN: Present time patient is doing well with no evidence of disease. I am please were overall progress. I've asked to see her back in 1 year for follow-up. She is again scheduled for mammograms this month which I will review.  I would like to take this opportunity to thank you for allowing me to participate in the care of your patient.Armstead Peaks., MD

## 2016-11-27 ENCOUNTER — Ambulatory Visit
Admission: RE | Admit: 2016-11-27 | Discharge: 2016-11-27 | Disposition: A | Discharge status: Home / Self Care | Payer: PPO | Source: Ambulatory Visit | Attending: Hematology and Oncology | Admitting: Hematology and Oncology

## 2016-11-27 DIAGNOSIS — Z170 Estrogen receptor positive status [ER+]: Principal | ICD-10-CM

## 2016-11-27 DIAGNOSIS — M818 Other osteoporosis without current pathological fracture: Secondary | ICD-10-CM | POA: Diagnosis not present

## 2016-11-27 DIAGNOSIS — M810 Age-related osteoporosis without current pathological fracture: Secondary | ICD-10-CM

## 2016-11-27 DIAGNOSIS — C50412 Malignant neoplasm of upper-outer quadrant of left female breast: Secondary | ICD-10-CM | POA: Diagnosis not present

## 2016-11-27 DIAGNOSIS — R928 Other abnormal and inconclusive findings on diagnostic imaging of breast: Secondary | ICD-10-CM | POA: Diagnosis not present

## 2016-12-24 DIAGNOSIS — I10 Essential (primary) hypertension: Secondary | ICD-10-CM | POA: Diagnosis not present

## 2016-12-24 DIAGNOSIS — E7800 Pure hypercholesterolemia, unspecified: Secondary | ICD-10-CM | POA: Diagnosis not present

## 2016-12-31 DIAGNOSIS — M159 Polyosteoarthritis, unspecified: Secondary | ICD-10-CM | POA: Diagnosis not present

## 2016-12-31 DIAGNOSIS — Z0000 Encounter for general adult medical examination without abnormal findings: Secondary | ICD-10-CM | POA: Diagnosis not present

## 2016-12-31 DIAGNOSIS — E7800 Pure hypercholesterolemia, unspecified: Secondary | ICD-10-CM | POA: Diagnosis not present

## 2016-12-31 DIAGNOSIS — T451X5A Adverse effect of antineoplastic and immunosuppressive drugs, initial encounter: Secondary | ICD-10-CM | POA: Diagnosis not present

## 2016-12-31 DIAGNOSIS — G620 Drug-induced polyneuropathy: Secondary | ICD-10-CM | POA: Diagnosis not present

## 2016-12-31 DIAGNOSIS — I10 Essential (primary) hypertension: Secondary | ICD-10-CM | POA: Diagnosis not present

## 2017-01-20 ENCOUNTER — Encounter: Payer: Self-pay | Admitting: Hematology and Oncology

## 2017-02-10 ENCOUNTER — Inpatient Hospital Stay: Payer: PPO | Attending: Hematology and Oncology

## 2017-02-10 ENCOUNTER — Encounter: Payer: Self-pay | Admitting: Hematology and Oncology

## 2017-02-10 ENCOUNTER — Other Ambulatory Visit: Payer: Self-pay | Admitting: Hematology and Oncology

## 2017-02-10 ENCOUNTER — Inpatient Hospital Stay (HOSPITAL_BASED_OUTPATIENT_CLINIC_OR_DEPARTMENT_OTHER): Payer: PPO | Admitting: Hematology and Oncology

## 2017-02-10 ENCOUNTER — Inpatient Hospital Stay: Payer: PPO

## 2017-02-10 VITALS — BP 139/79 | HR 69 | Temp 96.8°F | Resp 18 | Wt 121.4 lb

## 2017-02-10 DIAGNOSIS — C7951 Secondary malignant neoplasm of bone: Secondary | ICD-10-CM

## 2017-02-10 DIAGNOSIS — Z170 Estrogen receptor positive status [ER+]: Secondary | ICD-10-CM | POA: Diagnosis not present

## 2017-02-10 DIAGNOSIS — M810 Age-related osteoporosis without current pathological fracture: Secondary | ICD-10-CM

## 2017-02-10 DIAGNOSIS — M818 Other osteoporosis without current pathological fracture: Secondary | ICD-10-CM

## 2017-02-10 DIAGNOSIS — Z79899 Other long term (current) drug therapy: Secondary | ICD-10-CM | POA: Insufficient documentation

## 2017-02-10 DIAGNOSIS — G6289 Other specified polyneuropathies: Secondary | ICD-10-CM

## 2017-02-10 DIAGNOSIS — C50412 Malignant neoplasm of upper-outer quadrant of left female breast: Secondary | ICD-10-CM

## 2017-02-10 DIAGNOSIS — G622 Polyneuropathy due to other toxic agents: Secondary | ICD-10-CM

## 2017-02-10 LAB — COMPREHENSIVE METABOLIC PANEL
ALT: 24 U/L (ref 14–54)
AST: 33 U/L (ref 15–41)
Albumin: 4.3 g/dL (ref 3.5–5.0)
Alkaline Phosphatase: 73 U/L (ref 38–126)
Anion gap: 8 (ref 5–15)
BUN: 7 mg/dL (ref 6–20)
CO2: 28 mmol/L (ref 22–32)
Calcium: 9.6 mg/dL (ref 8.9–10.3)
Chloride: 95 mmol/L — ABNORMAL LOW (ref 101–111)
Creatinine, Ser: 0.69 mg/dL (ref 0.44–1.00)
GFR calc Af Amer: >60 mL/min (ref >60)
GFR calc non Af Amer: >60 mL/min (ref >60)
Glucose, Bld: 93 mg/dL (ref 65–99)
Potassium: 4.6 mmol/L (ref 3.5–5.1)
Sodium: 131 mmol/L — ABNORMAL LOW (ref 135–145)
Total Bilirubin: 0.6 mg/dL (ref 0.3–1.2)
Total Protein: 6.9 g/dL (ref 6.5–8.1)

## 2017-02-10 LAB — CBC WITH DIFFERENTIAL/PLATELET
Basophils Absolute: 0.1 10*3/uL (ref 0–0.1)
Basophils Relative: 1 %
Eosinophils Absolute: 0.1 10*3/uL (ref 0–0.7)
Eosinophils Relative: 1 %
HCT: 38.5 % (ref 35.0–47.0)
Hemoglobin: 13.5 g/dL (ref 12.0–16.0)
Lymphocytes Relative: 18 %
Lymphs Abs: 0.9 10*3/uL — ABNORMAL LOW (ref 1.0–3.6)
MCH: 31.8 pg (ref 26.0–34.0)
MCHC: 35.1 g/dL (ref 32.0–36.0)
MCV: 90.6 fL (ref 80.0–100.0)
Monocytes Absolute: 0.5 10*3/uL (ref 0.2–0.9)
Monocytes Relative: 10 %
Neutro Abs: 3.6 10*3/uL (ref 1.4–6.5)
Neutrophils Relative %: 70 %
Platelets: 290 10*3/uL (ref 150–440)
RBC: 4.25 MIL/uL (ref 3.80–5.20)
RDW: 13 % (ref 11.5–14.5)
WBC: 5.1 10*3/uL (ref 3.6–11.0)

## 2017-02-10 MED ORDER — DENOSUMAB 60 MG/ML ~~LOC~~ SOLN
60.0000 mg | Freq: Once | SUBCUTANEOUS | Status: AC
  Administered 2017-02-10: 60 mg via SUBCUTANEOUS
  Filled 2017-02-10: qty 1

## 2017-02-10 NOTE — Progress Notes (Signed)
[facility] day:  02/10/17  Chief Complaint: Raven Barber is an 66 y.o. female with a history of stage I left breast cancer who is seen for 6 month assessment prior to Prolia.  HPI: The patient was last seen in the medical oncology clinic on 08/19/2016.  At that time, she was tolerating her Femara well.  She had a residual grade I/II neuropathy in her feet and a grade I neuropathy in her fingertips.  Exam was stable.  Sodium was normal off of HCTZ.  She received Prolia.  Bilateral mammogram on 11/27/2016 revealed no evidence of malignancy in either breast.  During the interim, she has felt "pretty good". She still has a neuropathy.  She has increased her walking 3 minutes a day.  She now walks 25 minutes 3 times a week.  She denies any breast concerns.   Past Medical History:  Diagnosis Date  . Breast cancer ([place]) 2014   left breast/radiation and chemo  . Breast cancer, left breast ([place])   . History of bone density study 06/2014  . History of colonoscopy 2012  . History of mammogram 06/2014  . History of Papanicolaou smear of cervix 2013  . Hypercholesteremia   . Hypertension     Past Surgical History:  Procedure Laterality Date  . BREAST LUMPECTOMY  06/21/2013  . PORT-A-CATH REMOVAL  11/15/2014  . PORTACATH PLACEMENT    . TUBAL LIGATION     Family History  Problem Relation Age of Onset  . Breast cancer Cousin 42  . Cancer Paternal Uncle        lung  . Breast cancer Paternal Aunt 65   Social History:  reports that she has never smoked. She has never used smokeless tobacco. She reports that she does not drink alcohol or use drugs.  She lives in [place].  She works out on a treadmill 3 days/week.  The patient is accompanied by her husband today.  Allergies:  Allergies  Allergen Reactions  . Lisinopril     Other reaction(s): Cough   Current Medications: Current Outpatient Prescriptions  Medication Sig Dispense Refill  .  amLODipine (NORVASC) 5 MG tablet Take 5 mg by mouth daily.  0  . Calcium 600-200 MG-UNIT per tablet Take 2 tablets by mouth daily.    Marland Kitchen gabapentin (NEURONTIN) 100 MG capsule Take by mouth.    . letrozole (FEMARA) 2.5 MG tablet Take 1 tablet (2.5 mg total) by mouth daily. 90 tablet 3  . Omega-3 Fatty Acids (FISH OIL) 1000 MG CAPS Take 1 capsule by mouth daily.    . pravastatin (PRAVACHOL) 40 MG tablet Take 40 mg by mouth daily.  0  . hydrochlorothiazide (MICROZIDE) 12.5 MG capsule Take 12.5 mg by mouth daily. Reported on 11/09/2015  0   No current facility-administered medications for this visit.    Review of Systems:  GENERAL:  Feels "pretty good".  No fevers or sweats.  Weight down 1 pound since last visit. PERFORMANCE STATUS (ECOG):  1 HEENT:  No visual changes, runny nose, sore throat, mouth sores or tenderness.  No dental issues. Lungs: No shortness of breath or cough.  No hemoptysis. Cardiac:  No chest pain, palpitations, orthopnea, or PND. GI:  No nausea, vomiting, diarrhea, constipation, melena or hematochezia. GU:  No urgency, frequency, dysuria, or hematuria. Musculoskeletal:  No back pain.  No joint pain.  No muscle tenderness. Extremities:  No pain or swelling. Skin:  No rashes or skin changes.  Neuro:  Neuropathy in fingertip tingling (slower buttoning things) and balls of her feet.  No headache, numbness or weakness, balance or coordination issues. Endocrine:  No diabetes, thyroid issues, hot flashes or night sweats. Psych:  No mood changes, depression or anxiety. Pain:  No focal pain. Review of systems:  All other systems reviewed and found to be negative.  Physical Exam: Blood pressure 139/79, pulse 69, temperature (!) 96.8 F (36 C), temperature source Tympanic, resp. rate 18, weight 121 lb 6 oz (55.1 kg). GENERAL:  Well developed, well nourished, woman sitting comfortably in the exam room in no acute distress. MENTAL STATUS:  Alert and oriented to person, place and  time. HEAD:  Long gray hair.  Normocephalic, atraumatic, face symmetric, no Cushingoid features. EYES:  Glasses.  Hazel eyes.  Pupils equal round and reactive to light and accomodation.  No conjunctivitis or scleral icterus. ENT:  Oropharynx clear without lesion.  Tongue normal. Mucous membranes moist.  RESPIRATORY:  Clear to auscultation without rales, wheezes or rhonchi. CARDIOVASCULAR:  Regular rate and rhythm without murmur, rub or gallop. BREAST:  Right breast without masses, skin changes or nipple discharge.  Fibrocystic changes superiorly.  Left breast with post-operative changes at 8 o'clock.  No discrete masses, skin changes or nipple discharge. ABDOMEN:  Soft, non-tender, with active bowel sounds, and no hepatosplenomegaly.  No masses. SKIN:  No rashes, ulcers or lesions. EXTREMITIES: No edema, no skin discoloration or tenderness.  No palpable cords. LYMPH NODES: No palpable cervical, supraclavicular, axillary or inguinal adenopathy  NEUROLOGICAL: Unremarkable. PSYCH:  Appropriate.   Appointment on 02/10/2017  Component Date Value Ref Range Status  . WBC 02/10/2017 5.1  3.6 - 11.0 K/uL Final  . RBC 02/10/2017 4.25  3.80 - 5.20 MIL/uL Final  . Hemoglobin 02/10/2017 13.5  12.0 - 16.0 g/dL Final  . HCT 02/10/2017 38.5  35.0 - 47.0 % Final  . MCV 02/10/2017 90.6  80.0 - 100.0 fL Final  . MCH 02/10/2017 31.8  26.0 - 34.0 pg Final  . MCHC 02/10/2017 35.1  32.0 - 36.0 g/dL Final  . RDW 02/10/2017 13.0  11.5 - 14.5 % Final  . Platelets 02/10/2017 290  150 - 440 K/uL Final  . Neutrophils Relative % 02/10/2017 70  % Final  . Neutro Abs 02/10/2017 3.6  1.4 - 6.5 K/uL Final  . Lymphocytes Relative 02/10/2017 18  % Final  . Lymphs Abs 02/10/2017 0.9* 1.0 - 3.6 K/uL Final  . Monocytes Relative 02/10/2017 10  % Final  . Monocytes Absolute 02/10/2017 0.5  0.2 - 0.9 K/uL Final  . Eosinophils Relative 02/10/2017 1  % Final  . Eosinophils Absolute 02/10/2017 0.1  0 - 0.7 K/uL Final  .  Basophils Relative 02/10/2017 1  % Final  . Basophils Absolute 02/10/2017 0.1  0 - 0.1 K/uL Final  . Sodium 02/10/2017 131* 135 - 145 mmol/L Final  . Potassium 02/10/2017 4.6  3.5 - 5.1 mmol/L Final  . Chloride 02/10/2017 95* 101 - 111 mmol/L Final  . CO2 02/10/2017 28  22 - 32 mmol/L Final  . Glucose, Bld 02/10/2017 93  65 - 99 mg/dL Final  . BUN 02/10/2017 7  6 - 20 mg/dL Final  . Creatinine, Ser 02/10/2017 0.69  0.44 - 1.00 mg/dL Final  . Calcium 02/10/2017 9.6  8.9 - 10.3 mg/dL Final  . Total Protein 02/10/2017 6.9  6.5 - 8.1 g/dL Final  . Albumin 02/10/2017 4.3  3.5 - 5.0 g/dL Final  . AST 02/10/2017  33  15 - 41 U/L Final  . ALT 02/10/2017 24  14 - 54 U/L Final  . Alkaline Phosphatase 02/10/2017 73  38 - 126 U/L Final  . Total Bilirubin 02/10/2017 0.6  0.3 - 1.2 mg/dL Final  . GFR calc non Af Amer 02/10/2017 >60  >60 mL/min Final  . GFR calc Af Amer 02/10/2017 >60  >60 mL/min Final   Comment: (NOTE) The eGFR has been calculated using the CKD EPI equation. This calculation has not been validated in all clinical situations. eGFR's persistently <60 mL/min signify possible Chronic Kidney Disease.   . Anion gap 02/10/2017 8  5 - 15 Final    Assessment:  Raven Barber is an 66 y.o. female woman with a history of stage I left breast cancer status post lumpectomy and sentinel lymph node biopsy on 06/21/2013.  Pathology revealed a 7 mm grade II invasive mammary tumor.  Tumor was ER positive (>90%), PR negative (< 1%), and Her2/neu negative.  Two lymph nodes were negative.  Pathologic stage was T1bN0M0.  Oncotype DX testing revealed a recurrence score of 21.  She received 4 cycles of Adriamycin and Cytoxan (08/18/2013 - 10/20/2013) and 11-12 weeks of Taxol (11/10/2013 - 01/19/2014).  Her last 3 cycles of Taxol were dose reduced secondary to significant neuropathy.  She received radiation from 03/06/2014 - 04/27/2014.  Since completion of radiation, she has been on Femara.  She underwent  MyRisk genetic testing.  She had no deleterious mutation, but a variance of unknown significance (VUS) in the ATM gene which conveys a high risk for breast cancer and an increased risk of pancreatic cancer.  Bilateral diagnostic mammogram on 11/27/2016 revealed no evidence of malignancy.  CA27.29 has been followed: 46.1 on 12/11/2014, 42 on 01/01/2015, 30.8 on 04/23/2015, 39.2 on 07/22/2016, 36.6 on 08/19/2016, and 38.3 on 02/10/2017.  Bone density study on 07/05/2014 revealed osteoporosis with a T-score of -2.9 in L1-L4 and T score of -2.3 in the left femoral neck.  Bone density on 08/12/2016 revealed osteopenia with a T score of -2.5 in AP spine L1-L4 and -2.1 in the left femoral neck.  She is on calcium and vitamin D.  She receives Prolia every 6 months (last 08/19/2016).  Symptomatically, she is tolerating her Femara well.  She has a residual grade I/II in her feet and a grade I neuropathy in her fingertips.  She is active walking.  Exam is stable.  Sodium is 131.  Calcium is 9.6.  Plan: 1.  Labs today: CBC with diff, CMP, CA27.29. 2.  Review interval mammogram.  No evidence of malignancy. 3.  Prolia today. 4.  Continue calcium and vitamin D.  Continue Prolia every 6 months. 5.  Continue Femara. 6.  RTC 6 months for MD assessment, labs (CBC with diff, CMP, CA27.29), and Prolia.   Lequita Asal, MD  02/10/2017, 9:56 AM

## 2017-02-10 NOTE — Progress Notes (Signed)
Patient offers no complaints today.  Patient here today for mammo results and prolia injection.

## 2017-02-11 ENCOUNTER — Telehealth: Payer: Self-pay | Admitting: *Deleted

## 2017-02-11 LAB — CANCER ANTIGEN 27.29: CA 27.29: 38.3 U/mL (ref 0.0–38.6)

## 2017-02-11 NOTE — Telephone Encounter (Signed)
-----   Message from Lequita Asal, MD sent at 02/11/2017  8:59 AM EDT ----- Regarding: Please call patient  CA27.29 normal.  M  ----- Message ----- From: Interface, Lab In [facility] Sent: 02/10/2017   9:05 AM To: Lequita Asal, MD

## 2017-02-11 NOTE — Telephone Encounter (Signed)
Called patient to inform her that CA27.29 is normal.

## 2017-06-25 DIAGNOSIS — I10 Essential (primary) hypertension: Secondary | ICD-10-CM | POA: Diagnosis not present

## 2017-06-25 DIAGNOSIS — E7800 Pure hypercholesterolemia, unspecified: Secondary | ICD-10-CM | POA: Diagnosis not present

## 2017-07-15 DIAGNOSIS — T451X5A Adverse effect of antineoplastic and immunosuppressive drugs, initial encounter: Secondary | ICD-10-CM | POA: Diagnosis not present

## 2017-07-15 DIAGNOSIS — E7800 Pure hypercholesterolemia, unspecified: Secondary | ICD-10-CM | POA: Diagnosis not present

## 2017-07-15 DIAGNOSIS — G620 Drug-induced polyneuropathy: Secondary | ICD-10-CM | POA: Diagnosis not present

## 2017-07-15 DIAGNOSIS — M159 Polyosteoarthritis, unspecified: Secondary | ICD-10-CM | POA: Diagnosis not present

## 2017-07-15 DIAGNOSIS — I10 Essential (primary) hypertension: Secondary | ICD-10-CM | POA: Diagnosis not present

## 2017-07-27 ENCOUNTER — Other Ambulatory Visit: Payer: Self-pay | Admitting: Hematology and Oncology

## 2017-07-27 DIAGNOSIS — Z170 Estrogen receptor positive status [ER+]: Principal | ICD-10-CM

## 2017-07-27 DIAGNOSIS — M818 Other osteoporosis without current pathological fracture: Secondary | ICD-10-CM

## 2017-07-27 DIAGNOSIS — C50412 Malignant neoplasm of upper-outer quadrant of left female breast: Secondary | ICD-10-CM

## 2017-07-27 DIAGNOSIS — M810 Age-related osteoporosis without current pathological fracture: Secondary | ICD-10-CM

## 2017-08-13 ENCOUNTER — Inpatient Hospital Stay: Payer: PPO | Attending: Hematology and Oncology

## 2017-08-13 ENCOUNTER — Inpatient Hospital Stay (HOSPITAL_BASED_OUTPATIENT_CLINIC_OR_DEPARTMENT_OTHER): Payer: PPO | Admitting: Hematology and Oncology

## 2017-08-13 ENCOUNTER — Inpatient Hospital Stay: Payer: PPO

## 2017-08-13 ENCOUNTER — Other Ambulatory Visit: Payer: Self-pay

## 2017-08-13 ENCOUNTER — Encounter: Payer: Self-pay | Admitting: Hematology and Oncology

## 2017-08-13 VITALS — BP 120/74 | HR 73 | Temp 98.9°F | Resp 12 | Ht 60.0 in | Wt 122.1 lb

## 2017-08-13 DIAGNOSIS — C50412 Malignant neoplasm of upper-outer quadrant of left female breast: Secondary | ICD-10-CM

## 2017-08-13 DIAGNOSIS — Z79899 Other long term (current) drug therapy: Secondary | ICD-10-CM

## 2017-08-13 DIAGNOSIS — T451X5S Adverse effect of antineoplastic and immunosuppressive drugs, sequela: Secondary | ICD-10-CM

## 2017-08-13 DIAGNOSIS — G620 Drug-induced polyneuropathy: Secondary | ICD-10-CM

## 2017-08-13 DIAGNOSIS — M818 Other osteoporosis without current pathological fracture: Secondary | ICD-10-CM

## 2017-08-13 DIAGNOSIS — Z9221 Personal history of antineoplastic chemotherapy: Secondary | ICD-10-CM | POA: Insufficient documentation

## 2017-08-13 DIAGNOSIS — Z888 Allergy status to other drugs, medicaments and biological substances status: Secondary | ICD-10-CM | POA: Diagnosis not present

## 2017-08-13 DIAGNOSIS — M810 Age-related osteoporosis without current pathological fracture: Secondary | ICD-10-CM

## 2017-08-13 DIAGNOSIS — C7951 Secondary malignant neoplasm of bone: Secondary | ICD-10-CM | POA: Diagnosis not present

## 2017-08-13 DIAGNOSIS — Z170 Estrogen receptor positive status [ER+]: Secondary | ICD-10-CM

## 2017-08-13 DIAGNOSIS — E7800 Pure hypercholesterolemia, unspecified: Secondary | ICD-10-CM | POA: Insufficient documentation

## 2017-08-13 DIAGNOSIS — I10 Essential (primary) hypertension: Secondary | ICD-10-CM

## 2017-08-13 DIAGNOSIS — Z79811 Long term (current) use of aromatase inhibitors: Secondary | ICD-10-CM

## 2017-08-13 LAB — CBC WITH DIFFERENTIAL/PLATELET
Basophils Absolute: 0.1 10*3/uL (ref 0–0.1)
Basophils Relative: 1 %
Eosinophils Absolute: 0.1 10*3/uL (ref 0–0.7)
Eosinophils Relative: 1 %
HCT: 40.1 % (ref 35.0–47.0)
Hemoglobin: 13.6 g/dL (ref 12.0–16.0)
Lymphocytes Relative: 21 %
Lymphs Abs: 1.1 10*3/uL (ref 1.0–3.6)
MCH: 31.3 pg (ref 26.0–34.0)
MCHC: 33.8 g/dL (ref 32.0–36.0)
MCV: 92.6 fL (ref 80.0–100.0)
Monocytes Absolute: 0.5 10*3/uL (ref 0.2–0.9)
Monocytes Relative: 11 %
Neutro Abs: 3.4 10*3/uL (ref 1.4–6.5)
Neutrophils Relative %: 66 %
Platelets: 300 10*3/uL (ref 150–440)
RBC: 4.33 MIL/uL (ref 3.80–5.20)
RDW: 12.8 % (ref 11.5–14.5)
WBC: 5.1 10*3/uL (ref 3.6–11.0)

## 2017-08-13 LAB — COMPREHENSIVE METABOLIC PANEL
ALT: 27 U/L (ref 14–54)
AST: 32 U/L (ref 15–41)
Albumin: 4.1 g/dL (ref 3.5–5.0)
Alkaline Phosphatase: 88 U/L (ref 38–126)
Anion gap: 9 (ref 5–15)
BUN: 8 mg/dL (ref 6–20)
CO2: 28 mmol/L (ref 22–32)
Calcium: 9.4 mg/dL (ref 8.9–10.3)
Chloride: 96 mmol/L — ABNORMAL LOW (ref 101–111)
Creatinine, Ser: 0.67 mg/dL (ref 0.44–1.00)
GFR calc Af Amer: >60 mL/min (ref >60)
GFR calc non Af Amer: >60 mL/min (ref >60)
Glucose, Bld: 99 mg/dL (ref 65–99)
Potassium: 4.2 mmol/L (ref 3.5–5.1)
Sodium: 133 mmol/L — ABNORMAL LOW (ref 135–145)
Total Bilirubin: 0.6 mg/dL (ref 0.3–1.2)
Total Protein: 6.8 g/dL (ref 6.5–8.1)

## 2017-08-13 MED ORDER — DENOSUMAB 60 MG/ML ~~LOC~~ SOLN
60.0000 mg | Freq: Once | SUBCUTANEOUS | Status: AC
  Administered 2017-08-13: 60 mg via SUBCUTANEOUS
  Filled 2017-08-13: qty 1

## 2017-08-13 NOTE — Progress Notes (Signed)
See follow up. 

## 2017-08-13 NOTE — Progress Notes (Signed)
[facility] day:  08/13/17  Chief Complaint: Raven Barber is an 67 y.o. female with a history of stage I left breast cancer who is seen for 6 month assessment prior to Prolia.  HPI: The patient was last seen in the medical oncology clinic on 02/10/2017.  At that time, she felt "pretty good".  She was tolerating her Femara well.  She had a residual grade I/II in her feet and a grade I neuropathy in her fingertips.  She was active walking.  Mammogram revealed no evidence of malignancy.  Exam was stable.  Sodium was 131.  Calcium was 9.6.  She received Prolia.  During the interim, patient is feeling "good". She denies any acute physical concerns today. Patient does not verbalize any breast concerns. Patient has not experienced any B symptoms or interval infections.  She has a residual grade I/II in her feet and a grade I neuropathy in her fingertips. Patient notes that she continues to walk on the treadmill several times a week.   She is eating well, with no significant weight loss demonstrated. Patient denies pain today. She continues on her Femara as prescribed with no perceived side effects.    Past Medical History:  Diagnosis Date  . Breast cancer ([place]) 2014   left breast/radiation and chemo  . Breast cancer, left breast ([place])   . History of bone density study 06/2014  . History of colonoscopy 2012  . History of mammogram 06/2014  . History of Papanicolaou smear of cervix 2013  . Hypercholesteremia   . Hypertension     Past Surgical History:  Procedure Laterality Date  . BREAST LUMPECTOMY  06/21/2013  . PORT-A-CATH REMOVAL  11/15/2014  . PORTACATH PLACEMENT    . TUBAL LIGATION     Family History  Problem Relation Age of Onset  . Breast cancer Cousin 64  . Cancer Paternal Uncle        lung  . Breast cancer Paternal Aunt 15   Social History:  reports that  has never smoked. she has never used smokeless tobacco. She reports that she  does not drink alcohol or use drugs.  She lives in [place].  She works out on a treadmill 3 days/week.  The patient is accompanied by her husband today.  Allergies:  Allergies  Allergen Reactions  . Lisinopril     Other reaction(s): Cough   Current Medications: Current Outpatient Medications  Medication Sig Dispense Refill  . amLODipine (NORVASC) 5 MG tablet Take 5 mg by mouth daily.  0  . Calcium 600-200 MG-UNIT per tablet Take 2 tablets by mouth daily.    Marland Kitchen gabapentin (NEURONTIN) 100 MG capsule Take by mouth.    . hydrochlorothiazide (MICROZIDE) 12.5 MG capsule Take 12.5 mg by mouth daily. Reported on 11/09/2015  0  . letrozole (FEMARA) 2.5 MG tablet take 1 tablet by mouth once daily 90 tablet 3  . Omega-3 Fatty Acids (FISH OIL) 1000 MG CAPS Take 1 capsule by mouth daily.    . pravastatin (PRAVACHOL) 40 MG tablet Take 40 mg by mouth daily.  0   No current facility-administered medications for this visit.    Review of Systems:  GENERAL:  Feels  "pretty good".  No fevers or sweats.  Weight up 1 pound since last visit. PERFORMANCE STATUS (ECOG):  1 HEENT:  No visual changes, runny nose, sore throat, mouth sores or tenderness.  No dental issues. Lungs: No shortness of breath or cough.  No hemoptysis. Cardiac:  No chest pain, palpitations, orthopnea, or PND. GI:  No nausea, vomiting, diarrhea, constipation, melena or hematochezia. GU:  No urgency, frequency, dysuria, or hematuria. Musculoskeletal:  No back pain.  No joint pain.  No muscle tenderness. Extremities:  No pain or swelling. Skin:  No rashes or skin changes. Neuro:  Neuropathy in fingertip tingling (slower buttoning things) and balls of her feet.  No headache, numbness or weakness, balance or coordination issues. Endocrine:  No diabetes, thyroid issues, hot flashes or night sweats. Psych:  No mood changes, depression or anxiety. Pain:  No focal pain. Review of systems:  All other systems reviewed and found to be  negative.  Physical Exam: Blood pressure 120/74, pulse 73, temperature 98.9 F (37.2 C), temperature source Tympanic, resp. rate 12, height 5' (1.524 m), weight 122 lb 1.6 oz (55.4 kg). GENERAL:  Well developed, well nourished, woman sitting comfortably in the exam room in no acute distress. MENTAL STATUS:  Alert and oriented to person, place and time. HEAD:  Long gray hair.  Normocephalic, atraumatic, face symmetric, no Cushingoid features. EYES:  Glasses.  Brown eyes.  Pupils equal round and reactive to light and accomodation.  No conjunctivitis or scleral icterus. ENT:  Oropharynx clear without lesion.  Tongue normal. Mucous membranes moist.  RESPIRATORY:  Clear to auscultation without rales, wheezes or rhonchi. CARDIOVASCULAR:  Regular rate and rhythm without murmur, rub or gallop. BREAST:  Right breast without masses, skin changes or nipple discharge.  Fibrocystic changes superiorly.  Left breast with post-operative changes at 9 o'clock.  No discrete masses, skin changes or nipple discharge. ABDOMEN:  Soft, non-tender, with active bowel sounds, and no hepatosplenomegaly.  No masses. SKIN:  No rashes, ulcers or lesions. EXTREMITIES: No edema, no skin discoloration or tenderness.  No palpable cords. LYMPH NODES: No palpable cervical, supraclavicular, axillary or inguinal adenopathy  NEUROLOGICAL: Unremarkable. PSYCH:  Appropriate.   Appointment on 08/13/2017  Component Date Value Ref Range Status  . WBC 08/13/2017 5.1  3.6 - 11.0 K/uL Final  . RBC 08/13/2017 4.33  3.80 - 5.20 MIL/uL Final  . Hemoglobin 08/13/2017 13.6  12.0 - 16.0 g/dL Final  . HCT 08/13/2017 40.1  35.0 - 47.0 % Final  . MCV 08/13/2017 92.6  80.0 - 100.0 fL Final  . MCH 08/13/2017 31.3  26.0 - 34.0 pg Final  . MCHC 08/13/2017 33.8  32.0 - 36.0 g/dL Final  . RDW 08/13/2017 12.8  11.5 - 14.5 % Final  . Platelets 08/13/2017 300  150 - 440 K/uL Final  . Neutrophils Relative % 08/13/2017 66  % Final  . Neutro Abs  08/13/2017 3.4  1.4 - 6.5 K/uL Final  . Lymphocytes Relative 08/13/2017 21  % Final  . Lymphs Abs 08/13/2017 1.1  1.0 - 3.6 K/uL Final  . Monocytes Relative 08/13/2017 11  % Final  . Monocytes Absolute 08/13/2017 0.5  0.2 - 0.9 K/uL Final  . Eosinophils Relative 08/13/2017 1  % Final  . Eosinophils Absolute 08/13/2017 0.1  0 - 0.7 K/uL Final  . Basophils Relative 08/13/2017 1  % Final  . Basophils Absolute 08/13/2017 0.1  0 - 0.1 K/uL Final   Performed at [facility], [address], [place], [place] [address]  . Sodium 08/13/2017 133* 135 - 145 mmol/L Final  . Potassium 08/13/2017 4.2  3.5 - 5.1 mmol/L Final  . Chloride 08/13/2017 96* 101 - 111 mmol/L Final  . CO2 08/13/2017 28  22 - 32 mmol/L Final  .  Glucose, Bld 08/13/2017 99  65 - 99 mg/dL Final  . BUN 08/13/2017 8  6 - 20 mg/dL Final  . Creatinine, Ser 08/13/2017 0.67  0.44 - 1.00 mg/dL Final  . Calcium 08/13/2017 9.4  8.9 - 10.3 mg/dL Final  . Total Protein 08/13/2017 6.8  6.5 - 8.1 g/dL Final  . Albumin 08/13/2017 4.1  3.5 - 5.0 g/dL Final  . AST 08/13/2017 32  15 - 41 U/L Final  . ALT 08/13/2017 27  14 - 54 U/L Final  . Alkaline Phosphatase 08/13/2017 88  38 - 126 U/L Final  . Total Bilirubin 08/13/2017 0.6  0.3 - 1.2 mg/dL Final  . GFR calc non Af Amer 08/13/2017 >60  >60 mL/min Final  . GFR calc Af Amer 08/13/2017 >60  >60 mL/min Final   Comment: (NOTE) The eGFR has been calculated using the CKD EPI equation. This calculation has not been validated in all clinical situations. eGFR's persistently <60 mL/min signify possible Chronic Kidney Disease.   Georgiann Hahn gap 08/13/2017 9  5 - 15 Final   Performed at [facility], [address] [place], [place] [address]    Assessment:  Raven Barber is an 67 y.o. female woman with a history of stage I left breast cancer status post lumpectomy and sentinel lymph node biopsy on 06/21/2013.  Pathology revealed a 7 mm grade II invasive mammary tumor.  Tumor was ER  positive (>90%), PR negative (< 1%), and Her2/neu negative.  Two lymph nodes were negative.  Pathologic stage was [id number].  Oncotype DX testing revealed a recurrence score of 21.  She received 4 cycles of Adriamycin and Cytoxan (08/18/2013 - 10/20/2013) and 11-12 weeks of Taxol (11/10/2013 - 01/19/2014).  Her last 3 cycles of Taxol were dose reduced secondary to significant neuropathy.  She received radiation from 03/06/2014 - 04/27/2014.  Since completion of radiation, she has been on Femara.  She underwent MyRisk genetic testing.  She had no deleterious mutation, but a variance of unknown significance (VUS) in the ATM gene which conveys a high risk for breast cancer and an increased risk of pancreatic cancer.  Bilateral diagnostic mammogram on 11/27/2016 revealed no evidence of malignancy.  CA27.29 has been followed: 46.1 on 12/11/2014, 42 on 01/01/2015, 30.8 on 04/23/2015, 39.2 on 07/22/2016, 36.6 on 08/19/2016, 38.3 on 02/10/2017, and 32.3 on 08/13/2017.  Bone density study on 07/05/2014 revealed osteoporosis with a T-score of -2.9 in L1-L4 and T score of -2.3 in the left femoral neck.  Bone density on 08/12/2016 revealed osteopenia with a T score of -2.5 in AP spine L1-L4 and -2.1 in the left femoral neck.  She is on calcium and vitamin D.  She receives Prolia every 6 months (last 02/10/2017).  Symptomatically, she is tolerating her Femara well.  She has a residual grade I/II in her feet and a grade I neuropathy in her fingertips.  She is trying to be more active by walking.  Exam is stable. Fibrocystic changes noted in RIGHT breast. Scarring at the 9 o'clock position is noted in the LEFT breast.  Sodium is 133.  Calcium is 9.4.  Plan: 1.  Labs today: CBC with diff, CMP, CA27.29. 2.  Prolia injection today. 3.  Continue calcium and vitamin D.  Continue Prolia every 6 months. 4.  Continue Femara.  5.  Schedule bilateral diagnostic mammogram 11/27/2017. 6.  Discussed BCI testing to assess  benefit of extended adjuvant hormonal therapy. Information provided.  7.  RTC 6 months for  MD assessment, labs (CBC with diff, CMP, CA27.29), review of mammogram, and Prolia.   Honor Loh, NP  08/13/2017, 10:55 AM  I saw and evaluated the patient, participating in the key portions of the service and reviewing pertinent diagnostic studies and records.  I reviewed the nurse practitioner's note and agree with the findings and the plan.  The assessment and plan were discussed with the patient.  A few questions were asked by the patient and answered.   Nolon Stalls, MD 08/13/2017,10:55 AM

## 2017-08-14 LAB — CANCER ANTIGEN 27.29: CA 27.29: 32.3 U/mL (ref 0.0–38.6)

## 2017-11-25 ENCOUNTER — Other Ambulatory Visit: Payer: Self-pay

## 2017-11-25 ENCOUNTER — Ambulatory Visit
Admission: RE | Admit: 2017-11-25 | Discharge: 2017-11-25 | Disposition: A | Discharge status: Home / Self Care | Payer: PPO | Source: Ambulatory Visit | Attending: Radiation Oncology | Admitting: Radiation Oncology

## 2017-11-25 ENCOUNTER — Encounter: Payer: Self-pay | Admitting: Radiation Oncology

## 2017-11-25 VITALS — BP 149/70 | HR 73 | Temp 96.7°F | Resp 20 | Wt 122.9 lb

## 2017-11-25 DIAGNOSIS — Z170 Estrogen receptor positive status [ER+]: Secondary | ICD-10-CM | POA: Insufficient documentation

## 2017-11-25 DIAGNOSIS — C50412 Malignant neoplasm of upper-outer quadrant of left female breast: Secondary | ICD-10-CM | POA: Diagnosis not present

## 2017-11-25 DIAGNOSIS — Z923 Personal history of irradiation: Secondary | ICD-10-CM | POA: Insufficient documentation

## 2017-11-25 NOTE — Progress Notes (Signed)
Radiation Oncology Follow up Note  Name: Raven Barber   Date:   11/25/2017 MRN:  [id number] DOB: Feb 20, 1951    This 67 y.o. female presents to the clinic today for 3.5 year follow-up status post whole breast radiation for stage I invasive mammary carcinoma ER/PR positive.  REFERRING PROVIDER: Kirk Ruths, MD  HPI: patient is a67 year old female now seen out 3.5 years having completed whole breast radiation to her left breast for stage I ER/PR positive invasive mammary carcinoma. She seen today in routine follow-up and is doing well. She specifically denies breast tenderness cough or bone pain.Marland Kitchener last mammogram was 1 year prior and was BI-RADS 2 benign. She is scheduled 1 again for later this month.patient is currently on Femara tolerate that well without side effect.  COMPLICATIONS OF TREATMENT: none  FOLLOW UP COMPLIANCE: keeps appointments   PHYSICAL EXAM:  BP (!) 149/70   Pulse 73   Temp (!) 96.7 F (35.9 C)   Resp 20   Wt 122 lb 14.5 oz (55.7 kg)   BMI 24.00 kg/m  Lungs are clear to A&P cardiac examination essentially unremarkable with regular rate and rhythm. No dominant mass or nodularity is noted in either breast in 2 positions examined. Incision is well-healed. No axillary or supraclavicular adenopathy is appreciated. Cosmetic result is excellent. Well-developed well-nourished patient in NAD. HEENT reveals PERLA, EOMI, discs not visualized.  Oral cavity is clear. No oral mucosal lesions are identified. Neck is clear without evidence of cervical or supraclavicular adenopathy. Lungs are clear to A&P. Cardiac examination is essentially unremarkable with regular rate and rhythm without murmur rub or thrill. Abdomen is benign with no organomegaly or masses noted. Motor sensory and DTR levels are equal and symmetric in the upper and lower extremities. Cranial nerves II through XII are grossly intact. Proprioception is intact. No peripheral adenopathy or edema is identified. No  motor or sensory levels are noted. Crude visual fields are within normal range.  RADIOLOGY RESULTS: mammograms from last May reviewed will review her mammograms to be obtained in several weeks.  PLAN: this time patient continues to do well with no evidence of disease. I will review her mammograms later this month. I've asked to see her back in 1 year and then will discontinue follow-up care. Patient has: Anytime with any concerns. She's making decision about extension of her antiestrogen therapy. Patient is to call with any concerns at any time.  I would like to take this opportunity to thank you for allowing me to participate in the care of your patient.Noreene Filbert, MD

## 2017-11-30 DIAGNOSIS — D3131 Benign neoplasm of right choroid: Secondary | ICD-10-CM | POA: Diagnosis not present

## 2017-11-30 DIAGNOSIS — H2513 Age-related nuclear cataract, bilateral: Secondary | ICD-10-CM | POA: Diagnosis not present

## 2017-12-01 ENCOUNTER — Ambulatory Visit
Admission: RE | Admit: 2017-12-01 | Discharge: 2017-12-01 | Disposition: A | Discharge status: Home / Self Care | Payer: PPO | Source: Ambulatory Visit | Attending: Urgent Care | Admitting: Urgent Care

## 2017-12-01 DIAGNOSIS — R922 Inconclusive mammogram: Secondary | ICD-10-CM | POA: Diagnosis not present

## 2017-12-01 DIAGNOSIS — Z170 Estrogen receptor positive status [ER+]: Secondary | ICD-10-CM | POA: Insufficient documentation

## 2017-12-01 DIAGNOSIS — C50412 Malignant neoplasm of upper-outer quadrant of left female breast: Secondary | ICD-10-CM | POA: Diagnosis not present

## 2017-12-01 HISTORY — DX: Personal history of irradiation: Z92.3

## 2017-12-01 HISTORY — DX: Personal history of antineoplastic chemotherapy: Z92.21

## 2018-01-13 DIAGNOSIS — E7800 Pure hypercholesterolemia, unspecified: Secondary | ICD-10-CM | POA: Diagnosis not present

## 2018-01-13 DIAGNOSIS — I10 Essential (primary) hypertension: Secondary | ICD-10-CM | POA: Diagnosis not present

## 2018-01-19 ENCOUNTER — Inpatient Hospital Stay (HOSPITAL_BASED_OUTPATIENT_CLINIC_OR_DEPARTMENT_OTHER): Payer: PPO | Admitting: Hematology and Oncology

## 2018-01-19 ENCOUNTER — Inpatient Hospital Stay: Payer: PPO

## 2018-01-19 ENCOUNTER — Encounter: Payer: Self-pay | Admitting: Hematology and Oncology

## 2018-01-19 ENCOUNTER — Other Ambulatory Visit: Payer: Self-pay

## 2018-01-19 ENCOUNTER — Inpatient Hospital Stay: Payer: PPO | Attending: Hematology and Oncology

## 2018-01-19 VITALS — BP 127/77 | HR 74 | Temp 97.7°F | Resp 18 | Wt 122.5 lb

## 2018-01-19 DIAGNOSIS — Z79899 Other long term (current) drug therapy: Secondary | ICD-10-CM | POA: Diagnosis not present

## 2018-01-19 DIAGNOSIS — I10 Essential (primary) hypertension: Secondary | ICD-10-CM | POA: Insufficient documentation

## 2018-01-19 DIAGNOSIS — Z9221 Personal history of antineoplastic chemotherapy: Secondary | ICD-10-CM | POA: Insufficient documentation

## 2018-01-19 DIAGNOSIS — C50412 Malignant neoplasm of upper-outer quadrant of left female breast: Secondary | ICD-10-CM

## 2018-01-19 DIAGNOSIS — M810 Age-related osteoporosis without current pathological fracture: Secondary | ICD-10-CM

## 2018-01-19 DIAGNOSIS — Z803 Family history of malignant neoplasm of breast: Secondary | ICD-10-CM | POA: Insufficient documentation

## 2018-01-19 DIAGNOSIS — M818 Other osteoporosis without current pathological fracture: Secondary | ICD-10-CM | POA: Diagnosis not present

## 2018-01-19 DIAGNOSIS — Z170 Estrogen receptor positive status [ER+]: Secondary | ICD-10-CM

## 2018-01-19 DIAGNOSIS — T451X5S Adverse effect of antineoplastic and immunosuppressive drugs, sequela: Secondary | ICD-10-CM | POA: Insufficient documentation

## 2018-01-19 DIAGNOSIS — E7800 Pure hypercholesterolemia, unspecified: Secondary | ICD-10-CM | POA: Diagnosis not present

## 2018-01-19 DIAGNOSIS — Z7951 Long term (current) use of inhaled steroids: Secondary | ICD-10-CM | POA: Diagnosis not present

## 2018-01-19 DIAGNOSIS — G620 Drug-induced polyneuropathy: Secondary | ICD-10-CM | POA: Diagnosis not present

## 2018-01-19 DIAGNOSIS — Z801 Family history of malignant neoplasm of trachea, bronchus and lung: Secondary | ICD-10-CM | POA: Diagnosis not present

## 2018-01-19 DIAGNOSIS — Z923 Personal history of irradiation: Secondary | ICD-10-CM | POA: Insufficient documentation

## 2018-01-19 DIAGNOSIS — Z79811 Long term (current) use of aromatase inhibitors: Secondary | ICD-10-CM | POA: Diagnosis not present

## 2018-01-19 LAB — COMPREHENSIVE METABOLIC PANEL
ALT: 24 U/L (ref 0–44)
AST: 34 U/L (ref 15–41)
Albumin: 4.1 g/dL (ref 3.5–5.0)
Alkaline Phosphatase: 72 U/L (ref 38–126)
Anion gap: 9 (ref 5–15)
BUN: 9 mg/dL (ref 8–23)
CO2: 29 mmol/L (ref 22–32)
Calcium: 9.7 mg/dL (ref 8.9–10.3)
Chloride: 98 mmol/L (ref 98–111)
Creatinine, Ser: 0.68 mg/dL (ref 0.44–1.00)
GFR calc Af Amer: >60 mL/min (ref >60)
GFR calc non Af Amer: >60 mL/min (ref >60)
Glucose, Bld: 113 mg/dL — ABNORMAL HIGH (ref 70–99)
Potassium: 4.7 mmol/L (ref 3.5–5.1)
Sodium: 136 mmol/L (ref 135–145)
Total Bilirubin: 0.6 mg/dL (ref 0.3–1.2)
Total Protein: 6.9 g/dL (ref 6.5–8.1)

## 2018-01-19 LAB — CBC WITH DIFFERENTIAL/PLATELET
Basophils Absolute: 0.1 10*3/uL (ref 0–0.1)
Basophils Relative: 1 %
Eosinophils Absolute: 0.1 10*3/uL (ref 0–0.7)
Eosinophils Relative: 1 %
HCT: 38.6 % (ref 35.0–47.0)
Hemoglobin: 13.4 g/dL (ref 12.0–16.0)
Lymphocytes Relative: 17 %
Lymphs Abs: 1.1 10*3/uL (ref 1.0–3.6)
MCH: 32.2 pg (ref 26.0–34.0)
MCHC: 34.7 g/dL (ref 32.0–36.0)
MCV: 92.6 fL (ref 80.0–100.0)
Monocytes Absolute: 0.5 10*3/uL (ref 0.2–0.9)
Monocytes Relative: 7 %
Neutro Abs: 4.8 10*3/uL (ref 1.4–6.5)
Neutrophils Relative %: 74 %
Platelets: 291 10*3/uL (ref 150–440)
RBC: 4.16 MIL/uL (ref 3.80–5.20)
RDW: 13.1 % (ref 11.5–14.5)
WBC: 6.4 10*3/uL (ref 3.6–11.0)

## 2018-01-19 NOTE — Progress Notes (Signed)
Pt in for follow up, denies any difficulties or concerns today. 

## 2018-01-19 NOTE — Progress Notes (Signed)
[facility] day:  01/19/18  Chief Complaint: Raven Barber is an 67 y.o. female with a history of stage I left breast cancer who is seen for 6 month assessment prior to Prolia.  HPI: The patient was last seen in the medical oncology clinic on 08/13/2017.  At that time, she was tolerating her Femara well.  She had a residual grade I/II in her feet and a grade I neuropathy in her fingertips.  She was trying to be more active by walking.  Exam was stable. Fibrocystic changes noted in RIGHT breast. Scarring at the 9 o'clock position was noted in the LEFT breast.  Sodium was 133.  Calcium was 9.4.  She received Prolia.  Bilateral diagnostic mammogram on 12/01/2017 revealed no evidence of malignancy.  During the interim, patient is doing well overall. She is more active. Patient complains of continued neuropathy in her feet. She has some neuropathy in her fingertips. She notes that she drops things at times.   Patient denies that she has experienced any B symptoms. She denies any interval infections.  Patient does not verbalize any concerns with regards to her breasts today. Patient performs monthly self breast examinations as recommended. She continues on letrozole as previously prescribed, with no perceived side effects.   Patient advises that she maintains an adequate appetite. She is eating well.  Weight today is 122 lb 8 oz (55.6 kg), which compared to her last visit to the clinic, represents a stable weight  Patient denies pain in the clinic today.   Past Medical History:  Diagnosis Date  . Breast cancer ([place]) 2014   left breast/radiation and chemo  . Breast cancer, left breast ([place])   . History of bone density study 06/2014  . History of colonoscopy 2012  . History of mammogram 06/2014  . History of Papanicolaou smear of cervix 2013  . Hypercholesteremia   . Hypertension   . Personal history of chemotherapy   . Personal history of radiation  therapy     Past Surgical History:  Procedure Laterality Date  . BREAST BIOPSY Left 06/01/2013   invasive mammary carcinoma  . BREAST LUMPECTOMY Left 06/21/2013   grade II invasive mammary carcinoma, clear margins, LN negative  . PORT-A-CATH REMOVAL  11/15/2014  . PORTACATH PLACEMENT    . TUBAL LIGATION     Family History  Problem Relation Age of Onset  . Breast cancer Cousin 64  . Cancer Paternal Uncle        lung  . Breast cancer Paternal Aunt 20   Social History:  reports that she has never smoked. She has never used smokeless tobacco. She reports that she does not drink alcohol or use drugs.  She lives in [place].  She works out on a treadmill 3 days/week.  The patient is accompanied by her husband today.  Allergies:  Allergies  Allergen Reactions  . Ace Inhibitors Cough and Other (See Comments)  . Lisinopril     Other reaction(s): Cough   Current Medications: Current Outpatient Medications  Medication Sig Dispense Refill  . amLODipine (NORVASC) 2.5 MG tablet TK 1 T PO D  6  . Calcium 600-200 MG-UNIT per tablet Take 2 tablets by mouth daily.    Marland Kitchen gabapentin (NEURONTIN) 100 MG capsule Take by mouth.    . hydrochlorothiazide (MICROZIDE) 12.5 MG capsule Take 12.5 mg by mouth daily. Reported on 11/09/2015  0  . letrozole (FEMARA) 2.5 MG tablet take 1 tablet  by mouth once daily 90 tablet 3  . Omega-3 Fatty Acids (FISH OIL) 1000 MG CAPS Take 1 capsule by mouth daily.    . pravastatin (PRAVACHOL) 40 MG tablet Take 40 mg by mouth daily.  0   No current facility-administered medications for this visit.     Review of Systems  Constitutional: Negative for diaphoresis, fever, malaise/fatigue and weight loss (weight stable).  HENT: Negative.   Eyes: Negative.   Respiratory: Negative for cough, hemoptysis, sputum production and shortness of breath.   Cardiovascular: Negative for chest pain, palpitations, orthopnea, leg swelling and PND.  Gastrointestinal: Negative for  abdominal pain, blood in stool, constipation, diarrhea, melena, nausea and vomiting.  Genitourinary: Negative for dysuria, frequency, hematuria and urgency.  Musculoskeletal: Negative for back pain, falls, joint pain and myalgias.  Skin: Negative for itching and rash.  Neurological: Positive for tingling (Neuropathy in fingertip tingling (slower buttoning things) and balls of her feet.). Negative for dizziness, tremors, weakness and headaches.  Endo/Heme/Allergies: Does not bruise/bleed easily.  Psychiatric/Behavioral: Negative for depression, memory loss and suicidal ideas. The patient is not nervous/anxious and does not have insomnia.   All other systems reviewed and are negative.  Performance status (ECOG): 1 - Symptomatic but completely ambulatory  Physical Exam: Blood pressure 127/77, pulse 74, temperature 97.7 F (36.5 C), temperature source Tympanic, resp. rate 18, weight 122 lb 8 oz (55.6 kg). GENERAL:  Well developed, well nourished, woman sitting comfortably in the exam room in no acute distress. MENTAL STATUS:  Alert and oriented to person, place and time. HEAD:  Pearline Cables hair pulled back.  Normocephalic, atraumatic, face symmetric, no Cushingoid features. EYES:  Glasses.  Brown eyes.  Pupils equal round and reactive to light and accomodation.  No conjunctivitis or scleral icterus. ENT:  Oropharynx clear without lesion.  Tongue normal. Mucous membranes moist.  RESPIRATORY:  Clear to auscultation without rales, wheezes or rhonchi. CARDIOVASCULAR:  Regular rate and rhythm without murmur, rub or gallop. BREAST:  Right breast without masses, skin changes or nipple discharge.  Left breast with scarring at 9 o'clock. No discrete masses, skin changes or nipple discharge. ABDOMEN:  Soft, non-tender, with active bowel sounds, and no hepatosplenomegaly.  No masses. SKIN:  No rashes, ulcers or lesions. EXTREMITIES: No edema, no skin discoloration or tenderness.  No palpable cords. LYMPH NODES:  No palpable cervical, supraclavicular, axillary or inguinal adenopathy  NEUROLOGICAL: Unremarkable. PSYCH:  Appropriate.    Orders Only on 01/19/2018  Component Date Value Ref Range Status  . Sodium 01/19/2018 136  135 - 145 mmol/L Final  . Potassium 01/19/2018 4.7  3.5 - 5.1 mmol/L Final  . Chloride 01/19/2018 98  98 - 111 mmol/L Final   Please note change in reference range.  . CO2 01/19/2018 29  22 - 32 mmol/L Final  . Glucose, Bld 01/19/2018 113* 70 - 99 mg/dL Final   Please note change in reference range.  . BUN 01/19/2018 9  8 - 23 mg/dL Final   Please note change in reference range.  . Creatinine, Ser 01/19/2018 0.68  0.44 - 1.00 mg/dL Final  . Calcium 01/19/2018 9.7  8.9 - 10.3 mg/dL Final  . Total Protein 01/19/2018 6.9  6.5 - 8.1 g/dL Final  . Albumin 01/19/2018 4.1  3.5 - 5.0 g/dL Final  . AST 01/19/2018 34  15 - 41 U/L Final  . ALT 01/19/2018 24  0 - 44 U/L Final   Please note change in reference range.  . Alkaline Phosphatase 01/19/2018 72  38 - 126 U/L Final  . Total Bilirubin 01/19/2018 0.6  0.3 - 1.2 mg/dL Final  . GFR calc non Af Amer 01/19/2018 >60  >60 mL/min Final  . GFR calc Af Amer 01/19/2018 >60  >60 mL/min Final   Comment: (NOTE) The eGFR has been calculated using the CKD EPI equation. This calculation has not been validated in all clinical situations. eGFR's persistently <60 mL/min signify possible Chronic Kidney Disease.   Georgiann Hahn gap 01/19/2018 9  5 - 15 Final   Performed at [facility], [address] [place], [place] [address]  . WBC 01/19/2018 6.4  3.6 - 11.0 K/uL Final  . RBC 01/19/2018 4.16  3.80 - 5.20 MIL/uL Final  . Hemoglobin 01/19/2018 13.4  12.0 - 16.0 g/dL Final  . HCT 01/19/2018 38.6  35.0 - 47.0 % Final  . MCV 01/19/2018 92.6  80.0 - 100.0 fL Final  . MCH 01/19/2018 32.2  26.0 - 34.0 pg Final  . MCHC 01/19/2018 34.7  32.0 - 36.0 g/dL Final  . RDW 01/19/2018 13.1  11.5 - 14.5 % Final  . Platelets 01/19/2018 291  150 - 440  K/uL Final  . Neutrophils Relative % 01/19/2018 74  % Final  . Neutro Abs 01/19/2018 4.8  1.4 - 6.5 K/uL Final  . Lymphocytes Relative 01/19/2018 17  % Final  . Lymphs Abs 01/19/2018 1.1  1.0 - 3.6 K/uL Final  . Monocytes Relative 01/19/2018 7  % Final  . Monocytes Absolute 01/19/2018 0.5  0.2 - 0.9 K/uL Final  . Eosinophils Relative 01/19/2018 1  % Final  . Eosinophils Absolute 01/19/2018 0.1  0 - 0.7 K/uL Final  . Basophils Relative 01/19/2018 1  % Final  . Basophils Absolute 01/19/2018 0.1  0 - 0.1 K/uL Final   Performed at [facility], [address], [place], [place] [address]    Assessment:  Raven Barber is an 67 y.o. female woman with a history of stage I left breast cancer status post lumpectomy and sentinel lymph node biopsy on 06/21/2013.  Pathology revealed a 7 mm grade II invasive mammary tumor.  Tumor was ER positive (>90%), PR negative (< 1%), and Her2/neu negative.  Two lymph nodes were negative.  Pathologic stage was T1bN0M0.  Oncotype DX testing revealed a recurrence score of 21.  She received 4 cycles of Adriamycin and Cytoxan (08/18/2013 - 10/20/2013) and 11-12 weeks of Taxol (11/10/2013 - 01/19/2014).  Her last 3 cycles of Taxol were dose reduced secondary to significant neuropathy.  She received radiation from 03/06/2014 - 04/27/2014.  Since completion of radiation, she has been on Femara.  She underwent MyRisk genetic testing.  She had no deleterious mutation, but a variance of unknown significance (VUS) in the ATM gene which conveys a high risk for breast cancer and an increased risk of pancreatic cancer.  Bilateral diagnostic mammogram on 12/01/2017 revealed no evidence of malignancy.  CA27.29 has been followed: 46.1 on 12/11/2014, 42 on 01/01/2015, 30.8 on 04/23/2015, 39.2 on 07/22/2016, 36.6 on 08/19/2016, 38.3 on 02/10/2017, 32.3 on 08/13/2017, and 30.6 on 01/19/2018.  Bone density study on 07/05/2014 revealed osteoporosis with a T-score of -2.9 in L1-L4  and T score of -2.3 in the left femoral neck.  Bone density on 08/12/2016 revealed osteopenia with a T score of -2.5 in AP spine L1-L4 and -2.1 in the left femoral neck.  She is on calcium and vitamin D.  She receives Prolia every 6 months (last 08/13/2017).  Symptomatically, she  is doing well.  She is tolerating her Femara well.  She has a residual grade I/II in her feet and a grade I neuropathy in her fingertips.  She is trying to be more active by walking.  Exam is stable.  Fibrocystic changes noted in RIGHT breast. Scarring at the 9 o'clock position is noted in the LEFT breast.  Labs are unremarkable.    Plan: 1. Labs today: CBC with diff, CMP, CA27.29. 2. Review interval mammogram- no evidence of malignancy. 3. Continue calcium and vitamin D.  Continue Prolia every 6 months (next due 02/10/2018). 4. Continue Femara as previously prescribed.  Anticipate discontinuation on 04/28/2019. 5. Discuss BCI (breast cancer index) testing on the patient's original tumor that is stored in the pathology lab. Testing will be used to assess the benefit of extended (5 versus 10 years) of adjuvant hormonal therapy. Patient has decided against testing and wishes to discontinue endocrine therapy at the 5 year interval.   6. RTC 08/13/2018 for MD assessment, labs (CBC with diff, CMP, CA27.29), and Prolia.   Honor Loh, NP  01/19/2018, 11:47 AM  I saw and evaluated the patient, participating in the key portions of the service and reviewing pertinent diagnostic studies and records.  I reviewed the nurse practitioner's note and agree with the findings and the plan.  The assessment and plan were discussed with the patient.  A few questions were asked by the patient and answered.   Nolon Stalls, MD 01/19/2018,11:47 AM

## 2018-01-20 DIAGNOSIS — E7800 Pure hypercholesterolemia, unspecified: Secondary | ICD-10-CM | POA: Diagnosis not present

## 2018-01-20 DIAGNOSIS — I10 Essential (primary) hypertension: Secondary | ICD-10-CM | POA: Diagnosis not present

## 2018-01-20 DIAGNOSIS — T451X5A Adverse effect of antineoplastic and immunosuppressive drugs, initial encounter: Secondary | ICD-10-CM | POA: Diagnosis not present

## 2018-01-20 DIAGNOSIS — G620 Drug-induced polyneuropathy: Secondary | ICD-10-CM | POA: Diagnosis not present

## 2018-01-20 DIAGNOSIS — Z0000 Encounter for general adult medical examination without abnormal findings: Secondary | ICD-10-CM | POA: Diagnosis not present

## 2018-01-20 LAB — CA 27.29 (SERIAL MONITOR): CA 27.29: 30.6 U/mL (ref 0.0–38.6)

## 2018-02-10 ENCOUNTER — Inpatient Hospital Stay: Payer: PPO

## 2018-02-10 DIAGNOSIS — Z170 Estrogen receptor positive status [ER+]: Principal | ICD-10-CM

## 2018-02-10 DIAGNOSIS — M818 Other osteoporosis without current pathological fracture: Principal | ICD-10-CM

## 2018-02-10 DIAGNOSIS — M810 Age-related osteoporosis without current pathological fracture: Secondary | ICD-10-CM

## 2018-02-10 DIAGNOSIS — C50412 Malignant neoplasm of upper-outer quadrant of left female breast: Secondary | ICD-10-CM | POA: Diagnosis not present

## 2018-02-10 LAB — BASIC METABOLIC PANEL
Anion gap: 9 (ref 5–15)
BUN: 7 mg/dL — ABNORMAL LOW (ref 8–23)
CO2: 27 mmol/L (ref 22–32)
Calcium: 9.2 mg/dL (ref 8.9–10.3)
Chloride: 96 mmol/L — ABNORMAL LOW (ref 98–111)
Creatinine, Ser: 0.66 mg/dL (ref 0.44–1.00)
GFR calc Af Amer: >60 mL/min (ref >60)
GFR calc non Af Amer: >60 mL/min (ref >60)
Glucose, Bld: 115 mg/dL — ABNORMAL HIGH (ref 70–99)
Potassium: 4.4 mmol/L (ref 3.5–5.1)
Sodium: 132 mmol/L — ABNORMAL LOW (ref 135–145)

## 2018-02-10 MED ORDER — DENOSUMAB 60 MG/ML ~~LOC~~ SOSY
60.0000 mg | PREFILLED_SYRINGE | Freq: Once | SUBCUTANEOUS | Status: AC
  Administered 2018-02-10: 60 mg via SUBCUTANEOUS
  Filled 2018-02-10: qty 1

## 2018-07-20 DIAGNOSIS — E7800 Pure hypercholesterolemia, unspecified: Secondary | ICD-10-CM | POA: Diagnosis not present

## 2018-07-20 DIAGNOSIS — I10 Essential (primary) hypertension: Secondary | ICD-10-CM | POA: Diagnosis not present

## 2018-07-26 ENCOUNTER — Other Ambulatory Visit: Payer: PPO

## 2018-07-26 ENCOUNTER — Ambulatory Visit: Payer: PPO

## 2018-07-26 ENCOUNTER — Ambulatory Visit: Payer: PPO | Admitting: Hematology and Oncology

## 2018-07-26 ENCOUNTER — Other Ambulatory Visit: Payer: Self-pay | Admitting: Hematology and Oncology

## 2018-07-26 DIAGNOSIS — M810 Age-related osteoporosis without current pathological fracture: Secondary | ICD-10-CM

## 2018-07-26 DIAGNOSIS — Z170 Estrogen receptor positive status [ER+]: Principal | ICD-10-CM

## 2018-07-26 DIAGNOSIS — M818 Other osteoporosis without current pathological fracture: Secondary | ICD-10-CM

## 2018-07-26 DIAGNOSIS — C50412 Malignant neoplasm of upper-outer quadrant of left female breast: Secondary | ICD-10-CM

## 2018-07-28 DIAGNOSIS — I10 Essential (primary) hypertension: Secondary | ICD-10-CM | POA: Diagnosis not present

## 2018-07-28 DIAGNOSIS — E7800 Pure hypercholesterolemia, unspecified: Secondary | ICD-10-CM | POA: Diagnosis not present

## 2018-07-28 DIAGNOSIS — T451X5D Adverse effect of antineoplastic and immunosuppressive drugs, subsequent encounter: Secondary | ICD-10-CM | POA: Diagnosis not present

## 2018-07-28 DIAGNOSIS — G620 Drug-induced polyneuropathy: Secondary | ICD-10-CM | POA: Diagnosis not present

## 2018-07-28 DIAGNOSIS — M159 Polyosteoarthritis, unspecified: Secondary | ICD-10-CM | POA: Diagnosis not present

## 2018-08-13 ENCOUNTER — Ambulatory Visit: Payer: PPO | Admitting: Hematology and Oncology

## 2018-08-13 ENCOUNTER — Ambulatory Visit: Payer: PPO

## 2018-08-13 ENCOUNTER — Other Ambulatory Visit: Payer: PPO

## 2018-08-15 NOTE — Progress Notes (Signed)
[place]  [phone] [phone]  ID: Ura Yingling Gary OB: 1950-09-10  MR#: [id number]  XKP#:[id number]  Patient Care Team: Kirk Ruths, MD as PCP - General (Internal Medicine) Lequita Asal, MD as Referring Physician (Hematology and Oncology) Sherri Rad, MD as Referring Physician (Surgery)  CHIEF COMPLAINT: Stage IA ER/PR positive, HER-2 negative invasive carcinoma of the upper-outer quadrant of the left breast.  INTERVAL HISTORY: Patient is a 68 year old female with the above-stated breast cancer who previously was seen by another provider.  She continues to take letrozole and is tolerating this well.  She also takes Prolia every 6 months for her osteoporosis.  She currently feels well and is asymptomatic.  She has no neurologic complaints.  She denies any recent fevers or illnesses.  She has a good appetite and denies weight loss.  She has no chest pain or shortness of breath.  She denies any nausea, vomiting, constipation, or diarrhea.  She has no urinary complaints.  Patient feels at her baseline offers no specific complaints today.  REVIEW OF SYSTEMS:   Review of Systems  Constitutional: Negative.  Negative for fever, malaise/fatigue and weight loss.  Respiratory: Negative.  Negative for cough, hemoptysis and shortness of breath.   Cardiovascular: Negative.  Negative for chest pain and leg swelling.  Gastrointestinal: Negative.  Negative for abdominal pain, blood in stool and melena.  Genitourinary: Negative.  Negative for dysuria.  Musculoskeletal: Negative.  Negative for back pain.  Skin: Negative.  Negative for rash.  Neurological: Negative.  Negative for focal weakness, weakness and headaches.  Psychiatric/Behavioral: Negative.  The patient is not nervous/anxious.     As per HPI. Otherwise, a complete review of systems is negative.  PAST MEDICAL HISTORY: Past Medical History:  Diagnosis Date  . Breast cancer ([place]) 2014     left breast/radiation and chemo  . Breast cancer, left breast ([place])   . History of bone density study 06/2014  . History of colonoscopy 2012  . History of mammogram 06/2014  . History of Papanicolaou smear of cervix 2013  . Hypercholesteremia   . Hypertension   . Personal history of chemotherapy   . Personal history of radiation therapy     PAST SURGICAL HISTORY: Past Surgical History:  Procedure Laterality Date  . BREAST BIOPSY Left 06/01/2013   invasive mammary carcinoma  . BREAST LUMPECTOMY Left 06/21/2013   grade II invasive mammary carcinoma, clear margins, LN negative  . PORT-A-CATH REMOVAL  11/15/2014  . PORTACATH PLACEMENT    . TUBAL LIGATION      FAMILY HISTORY: Family History  Problem Relation Age of Onset  . Breast cancer Cousin 71  . Cancer Paternal Uncle        lung  . Breast cancer Paternal Aunt 50    ADVANCED DIRECTIVES (Y/N):  N  HEALTH MAINTENANCE: Social History   Tobacco Use  . Smoking status: Never Smoker  . Smokeless tobacco: Never Used  Substance Use Topics  . Alcohol use: No    Alcohol/week: 0.0 standard drinks  . Drug use: No     Colonoscopy:  PAP:  Bone density:  Lipid panel:  Allergies  Allergen Reactions  . Ace Inhibitors Cough and Other (See Comments)  . Lisinopril     Other reaction(s): Cough    Current Outpatient Medications  Medication Sig Dispense Refill  . amLODipine (NORVASC) 2.5 MG tablet TK 1 T PO D  6  . Calcium 600-200 MG-UNIT per tablet Take 2 tablets by  mouth daily.    Marland Kitchen gabapentin (NEURONTIN) 100 MG capsule Take by mouth.    . hydrochlorothiazide (MICROZIDE) 12.5 MG capsule Take 12.5 mg by mouth daily. Reported on 11/09/2015  0  . letrozole (FEMARA) 2.5 MG tablet TAKE 1 TABLET BY MOUTH ONCE DAILY 90 tablet 3  . Omega-3 Fatty Acids (FISH OIL) 1000 MG CAPS Take 1 capsule by mouth daily.    . pravastatin (PRAVACHOL) 40 MG tablet Take 40 mg by mouth daily.  0   No current facility-administered medications for  this visit.     OBJECTIVE: Vitals:   08/16/18 1415  BP: (!) 151/84  Pulse: 71  Temp: 97.6 F (36.4 C)     Body mass index is 23.94 kg/m.    ECOG FS:0 - Asymptomatic  General: Well-developed, well-nourished, no acute distress. Eyes: Pink conjunctiva, anicteric sclera. HEENT: Normocephalic, moist mucous membranes, clear oropharnyx. Breast: Patient declined breast exam today. Lungs: Clear to auscultation bilaterally. Heart: Regular rate and rhythm. No rubs, murmurs, or gallops. Abdomen: Soft, nontender, nondistended. No organomegaly noted, normoactive bowel sounds. Musculoskeletal: No edema, cyanosis, or clubbing. Neuro: Alert, answering all questions appropriately. Cranial nerves grossly intact. Skin: No rashes or petechiae noted. Psych: Normal affect.  LAB RESULTS:  Lab Results  Component Value Date   NA 136 08/16/2018   K 3.6 08/16/2018   CL 97 (L) 08/16/2018   CO2 30 08/16/2018   GLUCOSE 107 (H) 08/16/2018   BUN 11 08/16/2018   CREATININE 0.62 08/16/2018   CALCIUM 9.6 08/16/2018   PROT 7.0 08/16/2018   ALBUMIN 4.1 08/16/2018   AST 30 08/16/2018   ALT 23 08/16/2018   ALKPHOS 67 08/16/2018   BILITOT 0.6 08/16/2018   GFRNONAA >60 08/16/2018   GFRAA >60 08/16/2018    Lab Results  Component Value Date   WBC 6.5 08/16/2018   NEUTROABS 4.2 08/16/2018   HGB 13.0 08/16/2018   HCT 39.8 08/16/2018   MCV 92.8 08/16/2018   PLT 267 08/16/2018     STUDIES: No results found.  ASSESSMENT: Stage IA ER/PR positive, HER-2 negative invasive carcinoma of the upper-outer quadrant of the left breast.  PLAN:    1. Stage IA ER/PR positive, HER-2 negative invasive carcinoma of the upper-outer quadrant of the left breast: Patient underwent lumpectomy on June 21, 2013.  She did not require chemotherapy, but did receive adjuvant XRT.  She was subsequently placed on letrozole and will complete 5 years of treatment in October 2020.  Previously extended treatment was discussed,  but patient ultimately declined.  Her most recent mammogram on Dec 01, 2017 was reported as BI-RADS 2.  Repeat in May 2020.  No further intervention is needed.  Return to clinic in 6 months for routine evaluation. 2.  Osteoporosis: Proceed with Prolia as scheduled.  Patient's most recent bone mineral density on August 12, 2016 reported a T score of -2.5.  I spent a total of 30 minutes face-to-face with the patient of which greater than 50% of the visit was spent in counseling and coordination of care as detailed above.   Patient expressed understanding and was in agreement with this plan. She also understands that She can call clinic at any time with any questions, concerns, or complaints.   Cancer Staging Breast cancer of upper-outer quadrant of left female breast [facility]) Staging form: Breast, AJCC 7th Edition - Clinical stage from 06/21/2013: Stage IA (T1b, N0, M0) - Unsigned   Lloyd Huger, MD   08/17/2018 12:41 PM

## 2018-08-16 ENCOUNTER — Inpatient Hospital Stay: Payer: PPO

## 2018-08-16 ENCOUNTER — Other Ambulatory Visit: Payer: Self-pay

## 2018-08-16 ENCOUNTER — Other Ambulatory Visit: Payer: Self-pay | Admitting: Oncology

## 2018-08-16 ENCOUNTER — Inpatient Hospital Stay (HOSPITAL_BASED_OUTPATIENT_CLINIC_OR_DEPARTMENT_OTHER): Payer: PPO | Admitting: Oncology

## 2018-08-16 ENCOUNTER — Inpatient Hospital Stay: Payer: PPO | Attending: Oncology

## 2018-08-16 VITALS — BP 151/84 | HR 71 | Temp 97.6°F | Ht 60.0 in | Wt 122.6 lb

## 2018-08-16 DIAGNOSIS — E7800 Pure hypercholesterolemia, unspecified: Secondary | ICD-10-CM | POA: Diagnosis not present

## 2018-08-16 DIAGNOSIS — Z79899 Other long term (current) drug therapy: Secondary | ICD-10-CM

## 2018-08-16 DIAGNOSIS — M810 Age-related osteoporosis without current pathological fracture: Secondary | ICD-10-CM

## 2018-08-16 DIAGNOSIS — C50412 Malignant neoplasm of upper-outer quadrant of left female breast: Secondary | ICD-10-CM | POA: Diagnosis not present

## 2018-08-16 DIAGNOSIS — Z79811 Long term (current) use of aromatase inhibitors: Secondary | ICD-10-CM | POA: Diagnosis not present

## 2018-08-16 DIAGNOSIS — I10 Essential (primary) hypertension: Secondary | ICD-10-CM | POA: Insufficient documentation

## 2018-08-16 DIAGNOSIS — Z1231 Encounter for screening mammogram for malignant neoplasm of breast: Secondary | ICD-10-CM

## 2018-08-16 DIAGNOSIS — Z170 Estrogen receptor positive status [ER+]: Secondary | ICD-10-CM | POA: Diagnosis not present

## 2018-08-16 DIAGNOSIS — M818 Other osteoporosis without current pathological fracture: Principal | ICD-10-CM

## 2018-08-16 LAB — COMPREHENSIVE METABOLIC PANEL
ALT: 23 U/L (ref 0–44)
AST: 30 U/L (ref 15–41)
Albumin: 4.1 g/dL (ref 3.5–5.0)
Alkaline Phosphatase: 67 U/L (ref 38–126)
Anion gap: 9 (ref 5–15)
BUN: 11 mg/dL (ref 8–23)
CO2: 30 mmol/L (ref 22–32)
Calcium: 9.6 mg/dL (ref 8.9–10.3)
Chloride: 97 mmol/L — ABNORMAL LOW (ref 98–111)
Creatinine, Ser: 0.62 mg/dL (ref 0.44–1.00)
GFR calc Af Amer: >60 mL/min (ref >60)
GFR calc non Af Amer: >60 mL/min (ref >60)
Glucose, Bld: 107 mg/dL — ABNORMAL HIGH (ref 70–99)
Potassium: 3.6 mmol/L (ref 3.5–5.1)
Sodium: 136 mmol/L (ref 135–145)
Total Bilirubin: 0.6 mg/dL (ref 0.3–1.2)
Total Protein: 7 g/dL (ref 6.5–8.1)

## 2018-08-16 LAB — CBC WITH DIFFERENTIAL/PLATELET
Abs Immature Granulocytes: 0.01 10*3/uL (ref 0.00–0.07)
Basophils Absolute: 0.1 10*3/uL (ref 0.0–0.1)
Basophils Relative: 1 %
Eosinophils Absolute: 0.1 10*3/uL (ref 0.0–0.5)
Eosinophils Relative: 2 %
HCT: 39.8 % (ref 36.0–46.0)
Hemoglobin: 13 g/dL (ref 12.0–15.0)
Immature Granulocytes: 0 %
Lymphocytes Relative: 23 %
Lymphs Abs: 1.5 10*3/uL (ref 0.7–4.0)
MCH: 30.3 pg (ref 26.0–34.0)
MCHC: 32.7 g/dL (ref 30.0–36.0)
MCV: 92.8 fL (ref 80.0–100.0)
Monocytes Absolute: 0.7 10*3/uL (ref 0.1–1.0)
Monocytes Relative: 10 %
Neutro Abs: 4.2 10*3/uL (ref 1.7–7.7)
Neutrophils Relative %: 64 %
Platelets: 267 10*3/uL (ref 150–400)
RBC: 4.29 MIL/uL (ref 3.87–5.11)
RDW: 12.7 % (ref 11.5–15.5)
WBC: 6.5 10*3/uL (ref 4.0–10.5)
nRBC: 0 % (ref 0.0–0.2)

## 2018-08-16 MED ORDER — DENOSUMAB 60 MG/ML ~~LOC~~ SOSY
60.0000 mg | PREFILLED_SYRINGE | Freq: Once | SUBCUTANEOUS | Status: AC
  Administered 2018-08-16: 60 mg via SUBCUTANEOUS
  Filled 2018-08-16: qty 1

## 2018-08-16 NOTE — Progress Notes (Signed)
Patient is here today to follow up on her Malignant neoplasm of upper-outer quadrant of left breast. Patient stated that she had been doing well. Patient denied skin discoloration or nipple discharge. Patient's last mammogram was on 12/01/2017 and it was benign.

## 2018-08-17 LAB — CANCER ANTIGEN 27.29: CA 27.29: 34.5 U/mL (ref 0.0–38.6)

## 2018-11-18 ENCOUNTER — Encounter: Payer: Self-pay | Admitting: *Deleted

## 2018-12-03 ENCOUNTER — Other Ambulatory Visit: Payer: PPO

## 2018-12-09 ENCOUNTER — Ambulatory Visit: Payer: PPO | Admitting: Radiation Oncology

## 2018-12-15 ENCOUNTER — Ambulatory Visit
Admission: RE | Admit: 2018-12-15 | Discharge: 2018-12-15 | Disposition: A | Discharge status: Home / Self Care | Payer: PPO | Source: Ambulatory Visit | Attending: Oncology | Admitting: Oncology

## 2018-12-15 ENCOUNTER — Other Ambulatory Visit: Payer: Self-pay

## 2018-12-15 DIAGNOSIS — Z1231 Encounter for screening mammogram for malignant neoplasm of breast: Secondary | ICD-10-CM | POA: Diagnosis not present

## 2019-01-24 DIAGNOSIS — I10 Essential (primary) hypertension: Secondary | ICD-10-CM | POA: Diagnosis not present

## 2019-01-24 DIAGNOSIS — E7800 Pure hypercholesterolemia, unspecified: Secondary | ICD-10-CM | POA: Diagnosis not present

## 2019-01-27 IMAGING — MG MM DIGITAL DIAGNOSTIC BILAT W/ TOMO W/ CAD
9 of 14 series · 9 of 30 positions shown · non-contrast
Comparison: Previous exam(s).

CLINICAL DATA: 58-year-old female with history of left breast
cancer post lumpectomy 6117.

EXAM:
2D DIGITAL DIAGNOSTIC BILATERAL MAMMOGRAM WITH CAD AND ADJUNCT TOMO

[L MLO]
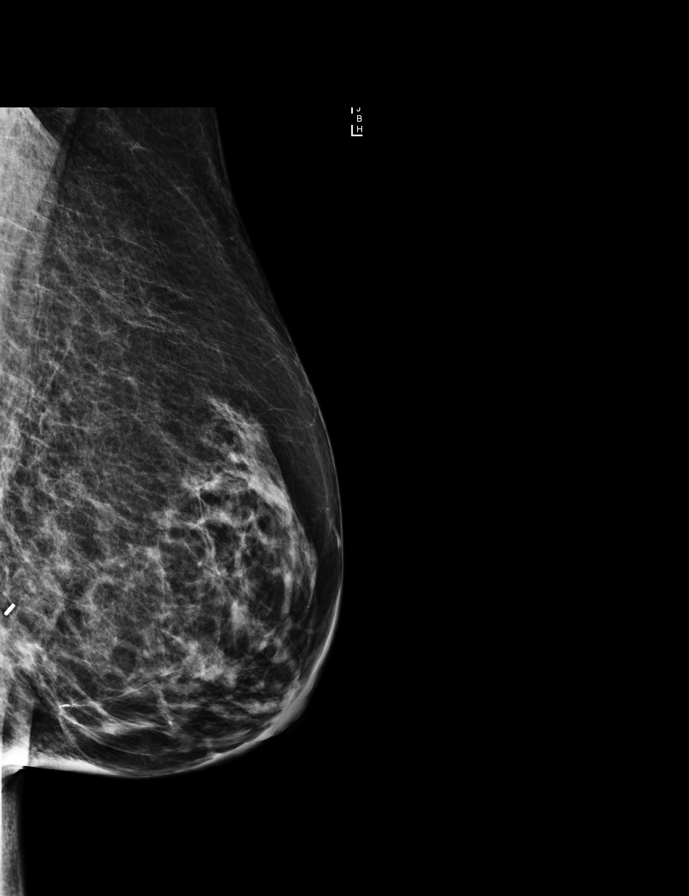

[L ML]
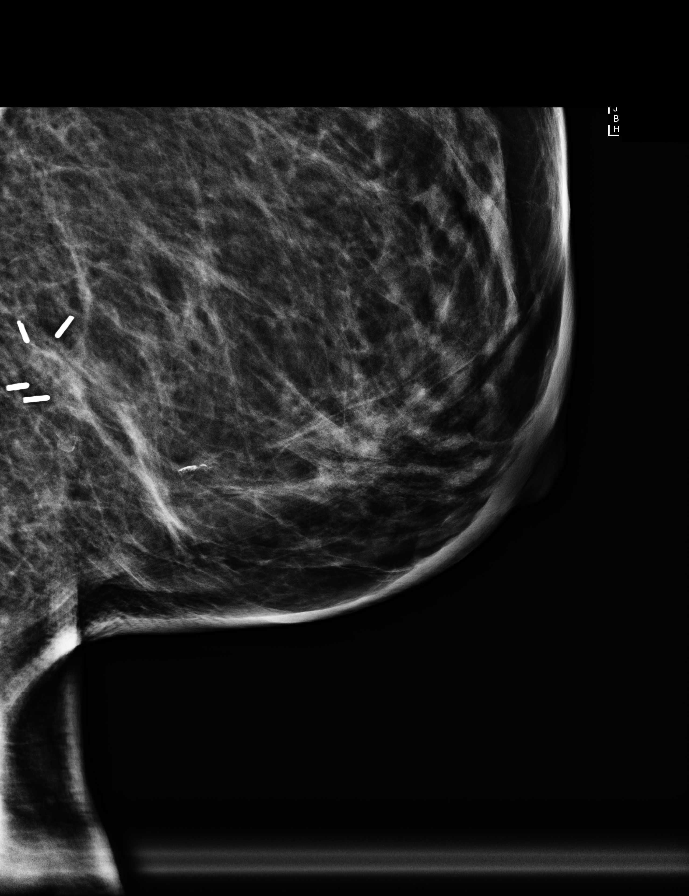

[L CC]
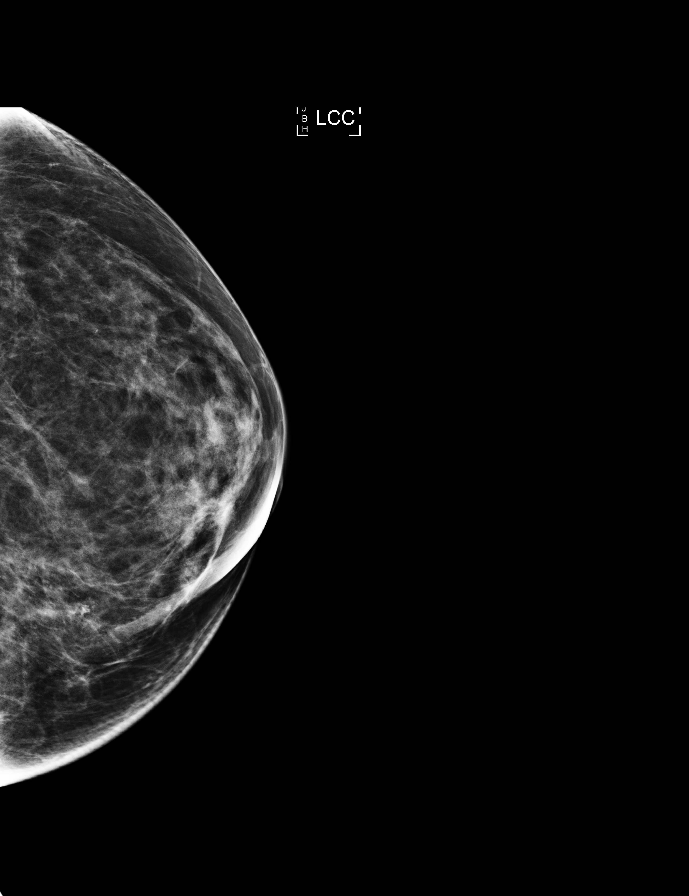

[R CC]
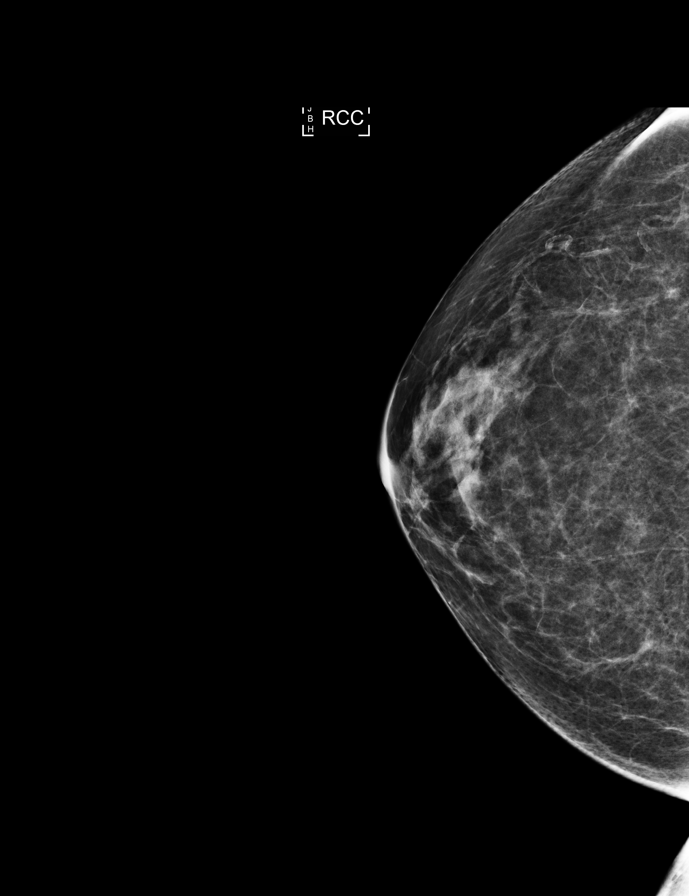

[R MLO synth-2D]
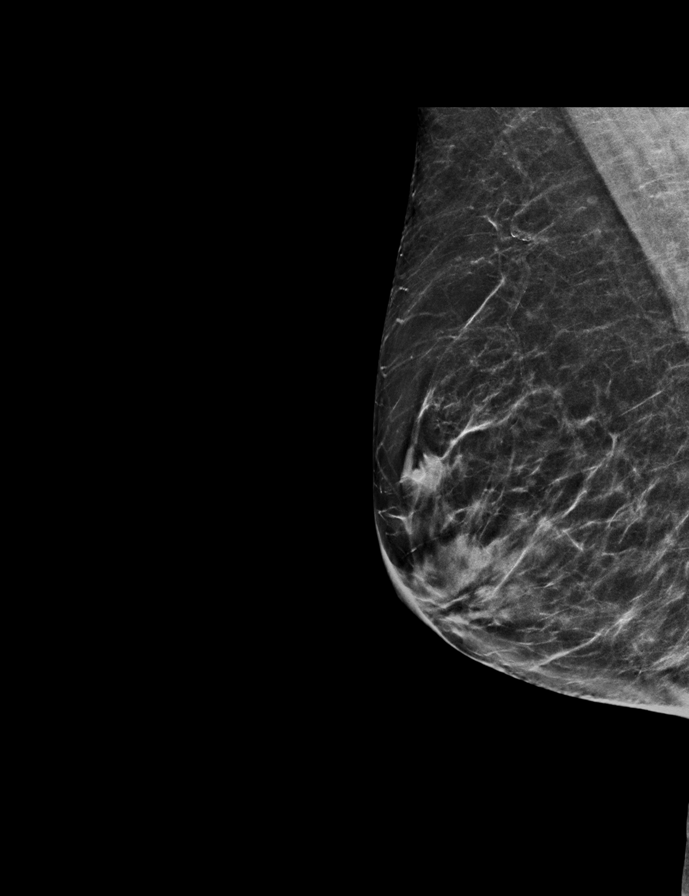

[R MLO]
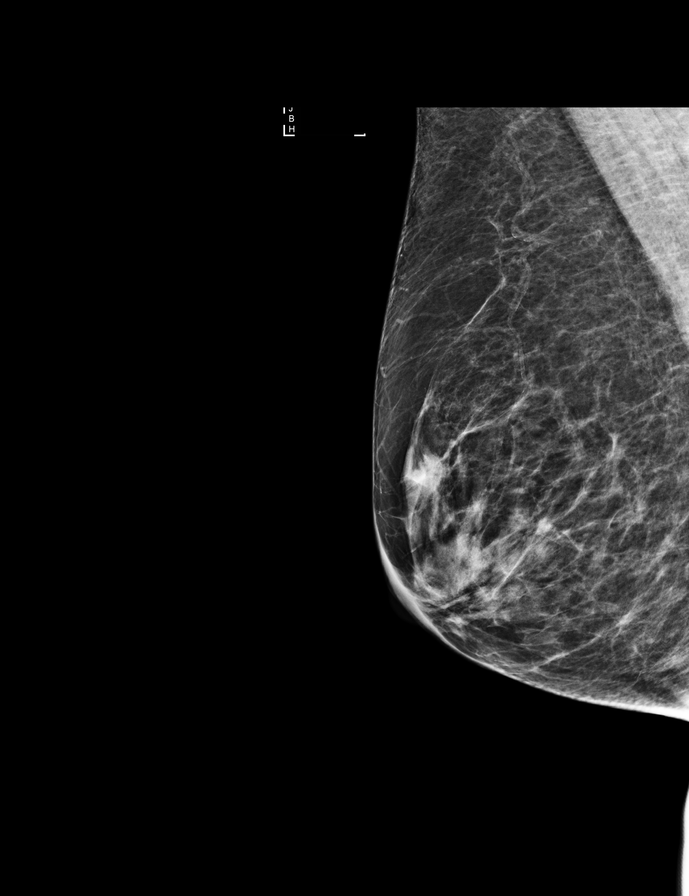

[L MLO synth-2D]
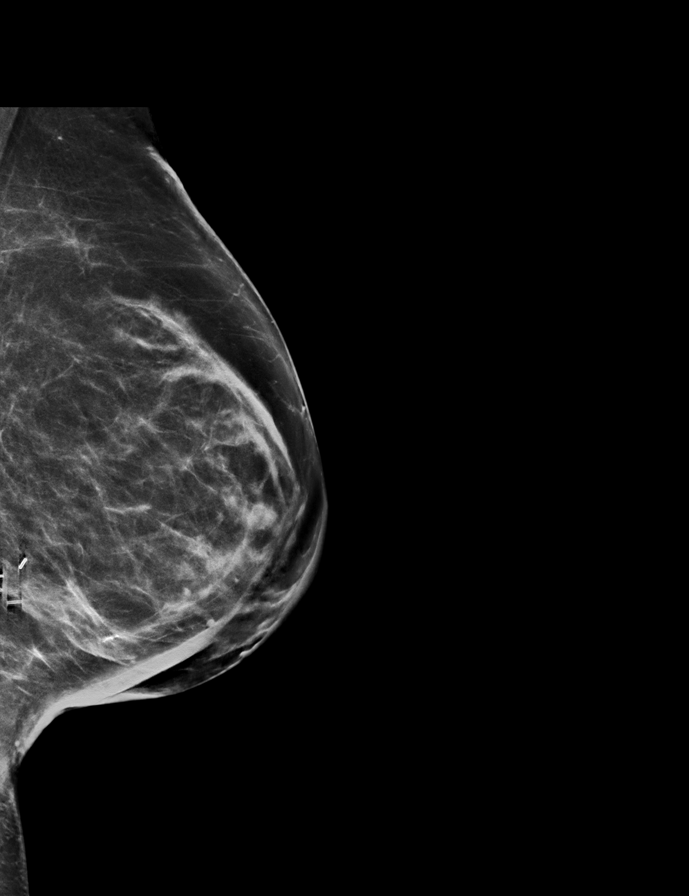

[L CC synth-2D]
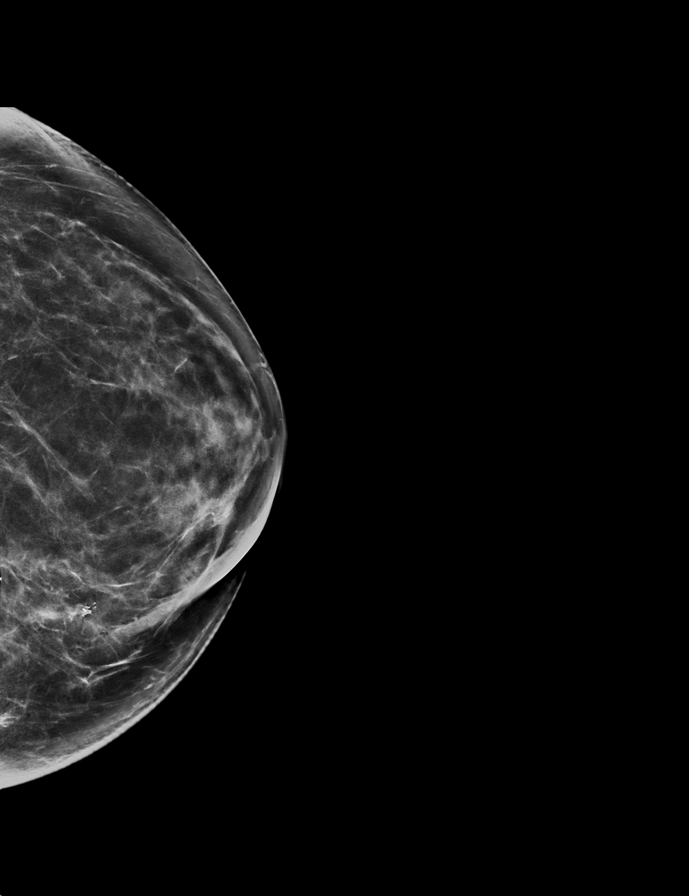

[R CC synth-2D]
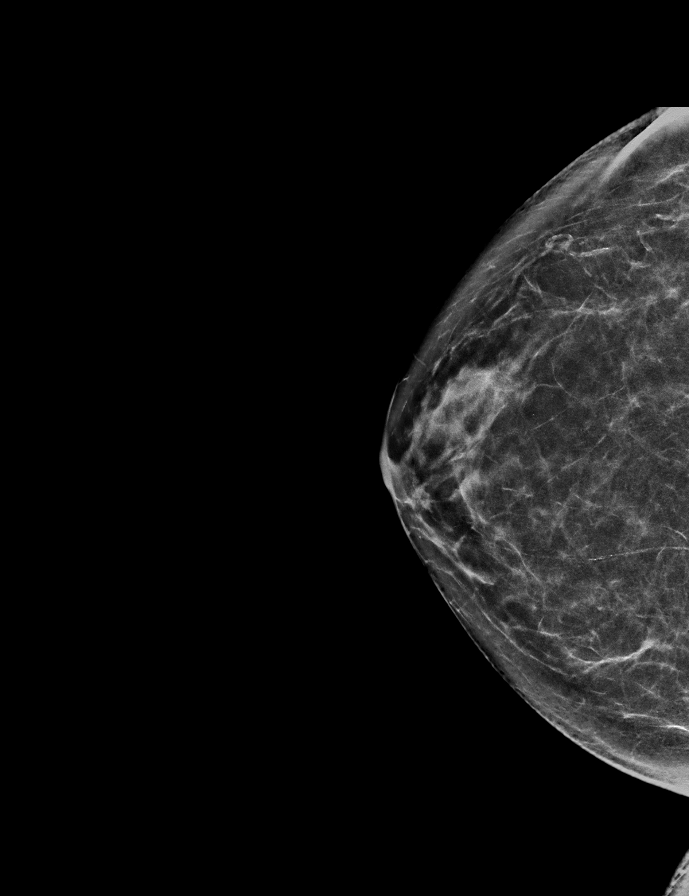

[9 of 30 positions shown; findings below may reference images not displayed]

ACR Breast Density Category b: There are scattered areas of
fibroglandular density.
FINDINGS: No suspicious masses or calcifications are seen in either breast.
Postsurgical changes are present in the lower inner far posterior
left breast related to prior lumpectomy. Spot compression
magnification mL view of the lumpectomy site in the left breast was
performed. There is no mammographic evidence of locally recurrent
malignancy.

Mammographic images were processed with CAD.
IMPRESSION: No mammographic evidence of malignancy in either breast.

RECOMMENDATION:
Diagnostic mammogram is suggested in 1 year. (Code:C1-6-UGU)

I have discussed the findings and recommendations with the patient.
Results were also provided in writing at the conclusion of the
visit. If applicable, a reminder letter will be sent to the patient
regarding the next appointment.

BI-RADS CATEGORY  2: Benign.

## 2019-01-31 DIAGNOSIS — E7800 Pure hypercholesterolemia, unspecified: Secondary | ICD-10-CM | POA: Diagnosis not present

## 2019-01-31 DIAGNOSIS — I10 Essential (primary) hypertension: Secondary | ICD-10-CM | POA: Diagnosis not present

## 2019-01-31 DIAGNOSIS — G620 Drug-induced polyneuropathy: Secondary | ICD-10-CM | POA: Diagnosis not present

## 2019-01-31 DIAGNOSIS — C50412 Malignant neoplasm of upper-outer quadrant of left female breast: Secondary | ICD-10-CM | POA: Diagnosis not present

## 2019-01-31 DIAGNOSIS — T451X5A Adverse effect of antineoplastic and immunosuppressive drugs, initial encounter: Secondary | ICD-10-CM | POA: Diagnosis not present

## 2019-01-31 DIAGNOSIS — Z0000 Encounter for general adult medical examination without abnormal findings: Secondary | ICD-10-CM | POA: Diagnosis not present

## 2019-02-10 ENCOUNTER — Other Ambulatory Visit: Payer: Self-pay | Admitting: Oncology

## 2019-02-11 ENCOUNTER — Other Ambulatory Visit: Payer: Self-pay

## 2019-02-13 NOTE — Progress Notes (Deleted)
[place]  Telephone:(336) [phone] Fax:(336) [phone]  ID: Raven Barber OB: 11-12-1950  MR#: [id number]  YIR#:[id number]  Patient Care Team: Kirk Ruths, MD as PCP - General (Internal Medicine) Lequita Asal, MD as Referring Physician (Hematology and Oncology) Sherri Rad, MD as Referring Physician (Surgery)  CHIEF COMPLAINT: Stage IA ER/PR positive, HER-2 negative invasive carcinoma of the upper-outer quadrant of the left breast.  INTERVAL HISTORY: Patient is a 68 year old female with the above-stated breast cancer who previously was seen by another provider.  She continues to take letrozole and is tolerating this well.  She also takes Prolia every 6 months for her osteoporosis.  She currently feels well and is asymptomatic.  She has no neurologic complaints.  She denies any recent fevers or illnesses.  She has a good appetite and denies weight loss.  She has no chest pain or shortness of breath.  She denies any nausea, vomiting, constipation, or diarrhea.  She has no urinary complaints.  Patient feels at her baseline offers no specific complaints today.  REVIEW OF SYSTEMS:   Review of Systems  Constitutional: Negative.  Negative for fever, malaise/fatigue and weight loss.  Respiratory: Negative.  Negative for cough, hemoptysis and shortness of breath.   Cardiovascular: Negative.  Negative for chest pain and leg swelling.  Gastrointestinal: Negative.  Negative for abdominal pain, blood in stool and melena.  Genitourinary: Negative.  Negative for dysuria.  Musculoskeletal: Negative.  Negative for back pain.  Skin: Negative.  Negative for rash.  Neurological: Negative.  Negative for focal weakness, weakness and headaches.  Psychiatric/Behavioral: Negative.  The patient is not nervous/anxious.     As per HPI. Otherwise, a complete review of systems is negative.  PAST MEDICAL HISTORY: Past Medical History:  Diagnosis Date  . Breast cancer ([place]) 2014    left breast/radiation and chemo  . Breast cancer, left breast ([place])   . History of bone density study 06/2014  . History of colonoscopy 2012  . History of mammogram 06/2014  . History of Papanicolaou smear of cervix 2013  . Hypercholesteremia   . Hypertension   . Personal history of chemotherapy   . Personal history of radiation therapy     PAST SURGICAL HISTORY: Past Surgical History:  Procedure Laterality Date  . BREAST BIOPSY Left 06/01/2013   invasive mammary carcinoma  . BREAST LUMPECTOMY Left 06/21/2013   grade II invasive mammary carcinoma, clear margins, LN negative  . PORT-A-CATH REMOVAL  11/15/2014  . PORTACATH PLACEMENT    . TUBAL LIGATION      FAMILY HISTORY: Family History  Problem Relation Age of Onset  . Breast cancer Cousin 76  . Cancer Paternal Uncle        lung  . Breast cancer Paternal Aunt 21    ADVANCED DIRECTIVES (Y/N):  N  HEALTH MAINTENANCE: Social History   Tobacco Use  . Smoking status: Never Smoker  . Smokeless tobacco: Never Used  Substance Use Topics  . Alcohol use: No    Alcohol/week: 0.0 standard drinks  . Drug use: No     Colonoscopy:  PAP:  Bone density:  Lipid panel:  Allergies  Allergen Reactions  . Ace Inhibitors Cough and Other (See Comments)  . Lisinopril     Other reaction(s): Cough    Current Outpatient Medications  Medication Sig Dispense Refill  . amLODipine (NORVASC) 2.5 MG tablet TK 1 T PO D  6  . Calcium 600-200 MG-UNIT per tablet Take 2 tablets by mouth  daily.    . gabapentin (NEURONTIN) 100 MG capsule Take by mouth.    . hydrochlorothiazide (MICROZIDE) 12.5 MG capsule Take 12.5 mg by mouth daily. Reported on 11/09/2015  0  . letrozole (FEMARA) 2.5 MG tablet TAKE 1 TABLET BY MOUTH ONCE DAILY 90 tablet 3  . Omega-3 Fatty Acids (FISH OIL) 1000 MG CAPS Take 1 capsule by mouth daily.    . pravastatin (PRAVACHOL) 40 MG tablet Take 40 mg by mouth daily.  0   No current facility-administered medications for  this visit.     OBJECTIVE: There were no vitals filed for this visit.   There is no height or weight on file to calculate BMI.    ECOG FS:0 - Asymptomatic  General: Well-developed, well-nourished, no acute distress. Eyes: Pink conjunctiva, anicteric sclera. HEENT: Normocephalic, moist mucous membranes, clear oropharnyx. Breast: Patient declined breast exam today. Lungs: Clear to auscultation bilaterally. Heart: Regular rate and rhythm. No rubs, murmurs, or gallops. Abdomen: Soft, nontender, nondistended. No organomegaly noted, normoactive bowel sounds. Musculoskeletal: No edema, cyanosis, or clubbing. Neuro: Alert, answering all questions appropriately. Cranial nerves grossly intact. Skin: No rashes or petechiae noted. Psych: Normal affect.  LAB RESULTS:  Lab Results  Component Value Date   NA 136 08/16/2018   K 3.6 08/16/2018   CL 97 (L) 08/16/2018   CO2 30 08/16/2018   GLUCOSE 107 (H) 08/16/2018   BUN 11 08/16/2018   CREATININE 0.62 08/16/2018   CALCIUM 9.6 08/16/2018   PROT 7.0 08/16/2018   ALBUMIN 4.1 08/16/2018   AST 30 08/16/2018   ALT 23 08/16/2018   ALKPHOS 67 08/16/2018   BILITOT 0.6 08/16/2018   GFRNONAA >60 08/16/2018   GFRAA >60 08/16/2018    Lab Results  Component Value Date   WBC 6.5 08/16/2018   NEUTROABS 4.2 08/16/2018   HGB 13.0 08/16/2018   HCT 39.8 08/16/2018   MCV 92.8 08/16/2018   PLT 267 08/16/2018     STUDIES: No results found.  ASSESSMENT: Stage IA ER/PR positive, HER-2 negative invasive carcinoma of the upper-outer quadrant of the left breast.  PLAN:    1. Stage IA ER/PR positive, HER-2 negative invasive carcinoma of the upper-outer quadrant of the left breast: Patient underwent lumpectomy on June 21, 2013.  She did not require chemotherapy, but did receive adjuvant XRT.  She was subsequently placed on letrozole and will complete 5 years of treatment in October 2020.  Previously extended treatment was discussed, but patient  ultimately declined.  Her most recent mammogram on Dec 01, 2017 was reported as BI-RADS 2.  Repeat in May 2020.  No further intervention is needed.  Return to clinic in 6 months for routine evaluation. 2.  Osteoporosis: Proceed with Prolia as scheduled.  Patient's most recent bone mineral density on August 12, 2016 reported a T score of -2.5.  I spent a total of 30 minutes face-to-face with the patient of which greater than 50% of the visit was spent in counseling and coordination of care as detailed above.   Patient expressed understanding and was in agreement with this plan. She also understands that She can call clinic at any time with any questions, concerns, or complaints.   Cancer Staging Breast cancer of upper-outer quadrant of left female breast [facility]) Staging form: Breast, AJCC 7th Edition - Clinical stage from 06/21/2013: Stage IA (T1b, N0, M0) - Unsigned   Lloyd Huger, MD   02/13/2019 10:04 AM

## 2019-02-14 ENCOUNTER — Inpatient Hospital Stay: Payer: PPO

## 2019-02-14 ENCOUNTER — Other Ambulatory Visit: Payer: Self-pay

## 2019-02-14 ENCOUNTER — Ambulatory Visit: Payer: PPO | Admitting: Radiation Oncology

## 2019-02-14 ENCOUNTER — Inpatient Hospital Stay: Payer: PPO | Admitting: Oncology

## 2019-03-14 NOTE — Progress Notes (Signed)
[place]  Telephone:(336) [phone] Fax:(336) [phone]  ID: Raven Barber OB: 09/05/1950  MR#: [id number]  YIF#:[id number]  Patient Care Team: Kirk Ruths, MD as PCP - General (Internal Medicine) Lequita Asal, MD as Referring Physician (Hematology and Oncology) Sherri Rad, MD as Referring Physician (Surgery)  CHIEF COMPLAINT: Stage IA ER/PR positive, HER-2 negative invasive carcinoma of the upper-outer quadrant of the left breast.  INTERVAL HISTORY: Patient returns to clinic today for routine 99-monthevaluation and continuation of Prolia.  She currently feels well and is asymptomatic.  She continues to tolerate letrozole without significant side effects. She has no neurologic complaints.  She denies any recent fevers or illnesses.  She has a good appetite and denies weight loss.  She denies any chest pain, shortness of breath, cough, or hemoptysis.  She denies any nausea, vomiting, constipation, or diarrhea.  She has no urinary complaints.  Patient offers no specific complaints today.  REVIEW OF SYSTEMS:   Review of Systems  Constitutional: Negative.  Negative for fever, malaise/fatigue and weight loss.  Respiratory: Negative.  Negative for cough, hemoptysis and shortness of breath.   Cardiovascular: Negative.  Negative for chest pain and leg swelling.  Gastrointestinal: Negative.  Negative for abdominal pain, blood in stool and melena.  Genitourinary: Negative.  Negative for dysuria.  Musculoskeletal: Negative.  Negative for back pain.  Skin: Negative.  Negative for rash.  Neurological: Negative.  Negative for focal weakness, weakness and headaches.  Psychiatric/Behavioral: Negative.  The patient is not nervous/anxious.     As per HPI. Otherwise, a complete review of systems is negative.  PAST MEDICAL HISTORY: Past Medical History:  Diagnosis Date  . [place]   left breast/radiation and chemo  . [place]    . History of bone density study 06/2014  . History of colonoscopy 2012  . History of mammogram 06/2014  . History of Papanicolaou smear of cervix 2013  . Hypercholesteremia   . Hypertension   . Personal history of chemotherapy   . Personal history of radiation therapy     PAST SURGICAL HISTORY: Past Surgical History:  Procedure Laterality Date  . BREAST BIOPSY Left 06/01/2013   invasive mammary carcinoma  . BREAST LUMPECTOMY Left 06/21/2013   grade II invasive mammary carcinoma, clear margins, LN negative  . PORT-A-CATH REMOVAL  11/15/2014  . PORTACATH PLACEMENT    . TUBAL LIGATION      FAMILY HISTORY: Family History  Problem Relation Age of Onset  . Breast cancer Cousin 437 . Cancer Paternal Uncle        lung  . Breast cancer Paternal Aunt 489   ADVANCED DIRECTIVES (Y/N):  N  HEALTH MAINTENANCE: Social History   Tobacco Use  . Smoking status: Never Smoker  . Smokeless tobacco: Never Used  Substance Use Topics  . Alcohol use: No    Alcohol/week: 0.0 standard drinks  . Drug use: No     Colonoscopy:  PAP:  Bone density:  Lipid panel:  Allergies  Allergen Reactions  . Ace Inhibitors Cough and Other (See Comments)  . Lisinopril     Other reaction(s): Cough    Current Outpatient Medications  Medication Sig Dispense Refill  . amLODipine (NORVASC) 2.5 MG tablet TK 1 T PO D  6  . Calcium 600-200 MG-UNIT per tablet Take 2 tablets by mouth daily.    .Marland Kitchengabapentin (NEURONTIN) 100 MG capsule Take by mouth.    . hydrochlorothiazide (MICROZIDE)  12.5 MG capsule Take 12.5 mg by mouth daily. Reported on 11/09/2015  0  . letrozole (FEMARA) 2.5 MG tablet TAKE 1 TABLET BY MOUTH ONCE DAILY 90 tablet 3  . Omega-3 Fatty Acids (FISH OIL) 1000 MG CAPS Take 1 capsule by mouth daily.    . pravastatin (PRAVACHOL) 40 MG tablet Take 40 mg by mouth daily.  0   No current facility-administered medications for this visit.     OBJECTIVE: There were no vitals filed for this visit.    There is no height or weight on file to calculate BMI.    ECOG FS:0 - Asymptomatic  General: Well-developed, well-nourished, no acute distress. Eyes: Pink conjunctiva, anicteric sclera. HEENT: Normocephalic, moist mucous membranes. Lungs: Clear to auscultation bilaterally. Heart: Regular rate and rhythm. No rubs, murmurs, or gallops. Abdomen: Soft, nontender, nondistended. No organomegaly noted, normoactive bowel sounds. Musculoskeletal: No edema, cyanosis, or clubbing. Neuro: Alert, answering all questions appropriately. Cranial nerves grossly intact. Skin: No rashes or petechiae noted. Psych: Normal affect.  LAB RESULTS:  Lab Results  Component Value Date   NA 134 (L) 03/17/2019   K 4.3 03/17/2019   CL 97 (L) 03/17/2019   CO2 27 03/17/2019   GLUCOSE 128 (H) 03/17/2019   BUN 13 03/17/2019   CREATININE 0.73 03/17/2019   CALCIUM 9.5 03/17/2019   PROT 7.2 03/17/2019   ALBUMIN 4.2 03/17/2019   AST 27 03/17/2019   ALT 21 03/17/2019   ALKPHOS 70 03/17/2019   BILITOT 0.6 03/17/2019   GFRNONAA >60 03/17/2019   GFRAA >60 03/17/2019    Lab Results  Component Value Date   WBC 6.8 03/17/2019   NEUTROABS 4.8 03/17/2019   HGB 13.7 03/17/2019   HCT 40.6 03/17/2019   MCV 91.4 03/17/2019   PLT 289 03/17/2019     STUDIES: No results found.  ASSESSMENT: Stage IA ER/PR positive, HER-2 negative invasive carcinoma of the upper-outer quadrant of the left breast.  PLAN:    1. Stage IA ER/PR positive, HER-2 negative invasive carcinoma of the upper-outer quadrant of the left breast: Patient underwent lumpectomy on June 21, 2013.  She did not require chemotherapy, but did receive adjuvant XRT.  She was subsequently placed on letrozole and will complete 5 years of treatment in October 2020.  Previously extended treatment was discussed, but patient ultimately declined.  Patient's most recent mammogram on Dec 15, 2018 was reported as BI-RADS 1.  Repeat in May 2021.  Return to clinic in 6  months for routine evaluation.  2.  Osteoporosis: Proceed with Prolia as scheduled.  Patient's most recent bone mineral density on August 12, 2016 reported a T score of -2.5.  Repeat in January 2021.  Return to clinic in 6 months as above.  I spent a total of 30 minutes face-to-face with the patient of which greater than 50% of the visit was spent in counseling and coordination of care as detailed above.  Patient expressed understanding and was in agreement with this plan. She also understands that She can call clinic at any time with any questions, concerns, or complaints.   Cancer Staging Breast cancer of upper-outer quadrant of left female breast [facility]) Staging form: Breast, AJCC 7th Edition - Clinical stage from 06/21/2013: Stage IA (T1b, N0, M0) - Unsigned   Lloyd Huger, MD   03/17/2019 6:58 PM

## 2019-03-16 ENCOUNTER — Other Ambulatory Visit: Payer: Self-pay

## 2019-03-16 DIAGNOSIS — C50412 Malignant neoplasm of upper-outer quadrant of left female breast: Secondary | ICD-10-CM

## 2019-03-16 DIAGNOSIS — Z170 Estrogen receptor positive status [ER+]: Secondary | ICD-10-CM

## 2019-03-17 ENCOUNTER — Inpatient Hospital Stay (HOSPITAL_BASED_OUTPATIENT_CLINIC_OR_DEPARTMENT_OTHER): Payer: PPO | Admitting: Oncology

## 2019-03-17 ENCOUNTER — Encounter: Payer: Self-pay | Admitting: Radiation Oncology

## 2019-03-17 ENCOUNTER — Other Ambulatory Visit: Payer: Self-pay

## 2019-03-17 ENCOUNTER — Inpatient Hospital Stay: Payer: PPO | Attending: Oncology

## 2019-03-17 ENCOUNTER — Inpatient Hospital Stay: Payer: PPO

## 2019-03-17 ENCOUNTER — Ambulatory Visit
Admission: RE | Admit: 2019-03-17 | Discharge: 2019-03-17 | Disposition: A | Discharge status: Home / Self Care | Payer: PPO | Source: Ambulatory Visit | Attending: Radiation Oncology | Admitting: Radiation Oncology

## 2019-03-17 VITALS — BP 145/97 | HR 93 | Temp 98.0°F | Resp 18 | Wt 121.7 lb

## 2019-03-17 DIAGNOSIS — E7800 Pure hypercholesterolemia, unspecified: Secondary | ICD-10-CM | POA: Diagnosis not present

## 2019-03-17 DIAGNOSIS — C50412 Malignant neoplasm of upper-outer quadrant of left female breast: Secondary | ICD-10-CM

## 2019-03-17 DIAGNOSIS — M810 Age-related osteoporosis without current pathological fracture: Secondary | ICD-10-CM

## 2019-03-17 DIAGNOSIS — Z803 Family history of malignant neoplasm of breast: Secondary | ICD-10-CM | POA: Insufficient documentation

## 2019-03-17 DIAGNOSIS — Z170 Estrogen receptor positive status [ER+]: Secondary | ICD-10-CM | POA: Insufficient documentation

## 2019-03-17 DIAGNOSIS — Z79811 Long term (current) use of aromatase inhibitors: Secondary | ICD-10-CM | POA: Diagnosis not present

## 2019-03-17 DIAGNOSIS — Z923 Personal history of irradiation: Secondary | ICD-10-CM | POA: Diagnosis not present

## 2019-03-17 DIAGNOSIS — Z79899 Other long term (current) drug therapy: Secondary | ICD-10-CM | POA: Insufficient documentation

## 2019-03-17 DIAGNOSIS — Z9221 Personal history of antineoplastic chemotherapy: Secondary | ICD-10-CM | POA: Diagnosis not present

## 2019-03-17 DIAGNOSIS — I10 Essential (primary) hypertension: Secondary | ICD-10-CM | POA: Diagnosis not present

## 2019-03-17 LAB — COMPREHENSIVE METABOLIC PANEL
ALT: 21 U/L (ref 0–44)
AST: 27 U/L (ref 15–41)
Albumin: 4.2 g/dL (ref 3.5–5.0)
Alkaline Phosphatase: 70 U/L (ref 38–126)
Anion gap: 10 (ref 5–15)
BUN: 13 mg/dL (ref 8–23)
CO2: 27 mmol/L (ref 22–32)
Calcium: 9.5 mg/dL (ref 8.9–10.3)
Chloride: 97 mmol/L — ABNORMAL LOW (ref 98–111)
Creatinine, Ser: 0.73 mg/dL (ref 0.44–1.00)
GFR calc Af Amer: >60 mL/min (ref >60)
GFR calc non Af Amer: >60 mL/min (ref >60)
Glucose, Bld: 128 mg/dL — ABNORMAL HIGH (ref 70–99)
Potassium: 4.3 mmol/L (ref 3.5–5.1)
Sodium: 134 mmol/L — ABNORMAL LOW (ref 135–145)
Total Bilirubin: 0.6 mg/dL (ref 0.3–1.2)
Total Protein: 7.2 g/dL (ref 6.5–8.1)

## 2019-03-17 LAB — CBC WITH DIFFERENTIAL/PLATELET
Abs Immature Granulocytes: 0.01 10*3/uL (ref 0.00–0.07)
Basophils Absolute: 0.1 10*3/uL (ref 0.0–0.1)
Basophils Relative: 1 %
Eosinophils Absolute: 0.1 10*3/uL (ref 0.0–0.5)
Eosinophils Relative: 1 %
HCT: 40.6 % (ref 36.0–46.0)
Hemoglobin: 13.7 g/dL (ref 12.0–15.0)
Immature Granulocytes: 0 %
Lymphocytes Relative: 17 %
Lymphs Abs: 1.2 10*3/uL (ref 0.7–4.0)
MCH: 30.9 pg (ref 26.0–34.0)
MCHC: 33.7 g/dL (ref 30.0–36.0)
MCV: 91.4 fL (ref 80.0–100.0)
Monocytes Absolute: 0.7 10*3/uL (ref 0.1–1.0)
Monocytes Relative: 10 %
Neutro Abs: 4.8 10*3/uL (ref 1.7–7.7)
Neutrophils Relative %: 71 %
Platelets: 289 10*3/uL (ref 150–400)
RBC: 4.44 MIL/uL (ref 3.87–5.11)
RDW: 12.5 % (ref 11.5–15.5)
WBC: 6.8 10*3/uL (ref 4.0–10.5)
nRBC: 0 % (ref 0.0–0.2)

## 2019-03-17 MED ORDER — DENOSUMAB 60 MG/ML ~~LOC~~ SOSY
60.0000 mg | PREFILLED_SYRINGE | Freq: Once | SUBCUTANEOUS | Status: AC
  Administered 2019-03-17: 11:00:00 60 mg via SUBCUTANEOUS
  Filled 2019-03-17: qty 1

## 2019-03-17 NOTE — Progress Notes (Signed)
Radiation Oncology Follow up Note  Name: Raven Barber   Date:   03/17/2019 MRN:  [id number] DOB: 18-Jun-1951    This 68 y.o. female presents to the clinic today for patient is a 68 year old female now about over 4 and half years having completed whole breast radiation to her left breast for stage I ER PR positive invasive mammary carcinoma.  Seen today in routine follow-up she is doing well specifically denies breast tenderness cough or bone pain..  Patient had mammograms back in May which I have reviewed were BI-RADS 1 negative.  She is currently on Femara will discontinue that at 5 years.  REFERRING PROVIDER: Kirk Ruths, MD  HPI: As above.  COMPLICATIONS OF TREATMENT: none  FOLLOW UP COMPLIANCE: keeps appointments   PHYSICAL EXAM:  BP (!) 145/97   Pulse 93   Temp 98 F (36.7 C)   Resp 18   Wt 121 lb 11.2 oz (55.2 kg)   BMI 23.77 kg/m  Lungs are clear to A&P cardiac examination essentially unremarkable with regular rate and rhythm. No dominant mass or nodularity is noted in either breast in 2 positions examined. Incision is well-healed. No axillary or supraclavicular adenopathy is appreciated. Cosmetic result is excellent.  Well-developed well-nourished patient in NAD. HEENT reveals PERLA, EOMI, discs not visualized.  Oral cavity is clear. No oral mucosal lesions are identified. Neck is clear without evidence of cervical or supraclavicular adenopathy. Lungs are clear to A&P. Cardiac examination is essentially unremarkable with regular rate and rhythm without murmur rub or thrill. Abdomen is benign with no organomegaly or masses noted. Motor sensory and DTR levels are equal and symmetric in the upper and lower extremities. Cranial nerves II through XII are grossly intact. Proprioception is intact. No peripheral adenopathy or edema is identified. No motor or sensory levels are noted. Crude visual fields are within normal range.  RADIOLOGY RESULTS: Mammograms reviewed compatible  with above-stated findings  PLAN: Present time patient is now close to 5 years out with no evidence of disease.  She will discontinue Femara at 5-year interval.  I am pleased with her overall progress will discontinue follow-up care at this time.  Patient knows to contact me in the future for any concerns.  I would like to take this opportunity to thank you for allowing me to participate in the care of your patient.Noreene Filbert, MD

## 2019-03-17 NOTE — Progress Notes (Signed)
Patient denies any concerns today.  

## 2019-03-18 LAB — CANCER ANTIGEN 27.29: CA 27.29: 35.5 U/mL (ref 0.0–38.6)

## 2019-07-27 DIAGNOSIS — I10 Essential (primary) hypertension: Secondary | ICD-10-CM | POA: Diagnosis not present

## 2019-07-27 DIAGNOSIS — E7800 Pure hypercholesterolemia, unspecified: Secondary | ICD-10-CM | POA: Diagnosis not present

## 2019-08-03 DIAGNOSIS — M159 Polyosteoarthritis, unspecified: Secondary | ICD-10-CM | POA: Diagnosis not present

## 2019-08-03 DIAGNOSIS — E7800 Pure hypercholesterolemia, unspecified: Secondary | ICD-10-CM | POA: Diagnosis not present

## 2019-08-03 DIAGNOSIS — I10 Essential (primary) hypertension: Secondary | ICD-10-CM | POA: Diagnosis not present

## 2019-08-15 ENCOUNTER — Ambulatory Visit
Admission: RE | Admit: 2019-08-15 | Discharge: 2019-08-15 | Disposition: A | Discharge status: Home / Self Care | Payer: PPO | Source: Ambulatory Visit | Attending: Oncology | Admitting: Oncology

## 2019-08-15 DIAGNOSIS — M8588 Other specified disorders of bone density and structure, other site: Secondary | ICD-10-CM | POA: Diagnosis not present

## 2019-08-15 DIAGNOSIS — M810 Age-related osteoporosis without current pathological fracture: Secondary | ICD-10-CM | POA: Diagnosis not present

## 2019-09-16 ENCOUNTER — Other Ambulatory Visit: Payer: Self-pay

## 2019-09-16 DIAGNOSIS — C50412 Malignant neoplasm of upper-outer quadrant of left female breast: Secondary | ICD-10-CM

## 2019-09-16 DIAGNOSIS — Z170 Estrogen receptor positive status [ER+]: Secondary | ICD-10-CM

## 2019-09-17 NOTE — Progress Notes (Signed)
[name]  Telephone:(336) [phone] Fax:(336) [phone]  ID: Raven Barber OB: September 16, 1950  MR#: [id number]  PPI#:[id number]  Patient Care Team: Kirk Ruths, MD as PCP - General (Internal Medicine) Lequita Asal, MD as Referring Physician (Hematology and Oncology) Sherri Rad, MD as Referring Physician (Surgery)  CHIEF COMPLAINT: Stage IA ER/PR positive, HER-2 negative invasive carcinoma of the upper-outer quadrant of the left breast.  INTERVAL HISTORY: Patient returns to clinic today for routine 89-monthevaluation, discussion of her bone mineral density results, and consideration of Prolia.  She continues to feel well and remains asymptomatic.  She has no neurologic complaints.  She denies any recent fevers or illnesses.  She has a good appetite and denies weight loss.  She denies any chest pain, shortness of breath, cough, or hemoptysis.  She denies any nausea, vomiting, constipation, or diarrhea.  She has no urinary complaints.  Patient feels at her baseline offers no specific complaints today.  REVIEW OF SYSTEMS:   Review of Systems  Constitutional: Negative.  Negative for fever, malaise/fatigue and weight loss.  Respiratory: Negative.  Negative for cough, hemoptysis and shortness of breath.   Cardiovascular: Negative.  Negative for chest pain and leg swelling.  Gastrointestinal: Negative.  Negative for abdominal pain, blood in stool and melena.  Genitourinary: Negative.  Negative for dysuria.  Musculoskeletal: Negative.  Negative for back pain.  Skin: Negative.  Negative for rash.  Neurological: Negative.  Negative for focal weakness, weakness and headaches.  Psychiatric/Behavioral: Negative.  The patient is not nervous/anxious.     As per HPI. Otherwise, a complete review of systems is negative.  PAST MEDICAL HISTORY: Past Medical History:  Diagnosis Date  . [place]   left breast/radiation and chemo  . [facility]   . History of bone density study 06/2014  . History of colonoscopy 2012  . History of mammogram 06/2014  . History of Papanicolaou smear of cervix 2013  . Hypercholesteremia   . Hypertension   . Personal history of chemotherapy   . Personal history of radiation therapy     PAST SURGICAL HISTORY: Past Surgical History:  Procedure Laterality Date  . BREAST BIOPSY Left 06/01/2013   invasive mammary carcinoma  . BREAST LUMPECTOMY Left 06/21/2013   grade II invasive mammary carcinoma, clear margins, LN negative  . PORT-A-CATH REMOVAL  11/15/2014  . PORTACATH PLACEMENT    . TUBAL LIGATION      FAMILY HISTORY: Family History  Problem Relation Age of Onset  . Breast cancer Cousin 415 . Cancer Paternal Uncle        lung  . Breast cancer Paternal Aunt 459   ADVANCED DIRECTIVES (Y/N):  N  HEALTH MAINTENANCE: Social History   Tobacco Use  . Smoking status: Never Smoker  . Smokeless tobacco: Never Used  Substance Use Topics  . Alcohol use: No    Alcohol/week: 0.0 standard drinks  . Drug use: No     Colonoscopy:  PAP:  Bone density:  Lipid panel:  Allergies  Allergen Reactions  . Ace Inhibitors Cough and Other (See Comments)  . Lisinopril     Other reaction(s): Cough    Current Outpatient Medications  Medication Sig Dispense Refill  . amLODipine (NORVASC) 2.5 MG tablet TK 1 T PO D  6  . Calcium 600-200 MG-UNIT per tablet Take 2 tablets by mouth daily.    .Marland Kitchengabapentin (NEURONTIN) 100 MG capsule Take by mouth.    .Marland Kitchen  Omega-3 Fatty Acids (FISH OIL) 1000 MG CAPS Take 1 capsule by mouth daily.    . pravastatin (PRAVACHOL) 40 MG tablet Take 40 mg by mouth daily.  0  . hydrochlorothiazide (MICROZIDE) 12.5 MG capsule Take 12.5 mg by mouth daily. Reported on 11/09/2015  0  . letrozole (FEMARA) 2.5 MG tablet TAKE 1 TABLET BY MOUTH ONCE DAILY (Patient not taking: Reported on 09/19/2019) 90 tablet 3   No current facility-administered medications for this visit.     OBJECTIVE: Vitals:   09/19/19 1048  BP: (!) 157/83  Pulse: 91  Temp: 98.1 F (36.7 C)  SpO2: 100%     Body mass index is 23.94 kg/m.    ECOG FS:0 - Asymptomatic  General: Thin, no acute distress. Eyes: Pink conjunctiva, anicteric sclera. HEENT: Normocephalic, moist mucous membranes. Lungs: No audible wheezing or coughing. Heart: Regular rate and rhythm. Abdomen: Soft, nontender, no obvious distention. Musculoskeletal: No edema, cyanosis, or clubbing. Neuro: Alert, answering all questions appropriately. Cranial nerves grossly intact. Skin: No rashes or petechiae noted. Psych: Normal affect.   LAB RESULTS:  Lab Results  Component Value Date   NA 133 (L) 09/19/2019   K 4.2 09/19/2019   CL 95 (L) 09/19/2019   CO2 28 09/19/2019   GLUCOSE 114 (H) 09/19/2019   BUN 9 09/19/2019   CREATININE 0.70 09/19/2019   CALCIUM 10.3 09/19/2019   PROT 7.7 09/19/2019   ALBUMIN 4.6 09/19/2019   AST 33 09/19/2019   ALT 25 09/19/2019   ALKPHOS 80 09/19/2019   BILITOT 0.7 09/19/2019   GFRNONAA >60 09/19/2019   GFRAA >60 09/19/2019    Lab Results  Component Value Date   WBC 7.3 09/19/2019   NEUTROABS 5.0 09/19/2019   HGB 14.1 09/19/2019   HCT 42.4 09/19/2019   MCV 92.6 09/19/2019   PLT 310 09/19/2019     STUDIES: No results found.  ASSESSMENT: Stage IA ER/PR positive, HER-2 negative invasive carcinoma of the upper-outer quadrant of the left breast.  PLAN:    1. Stage IA ER/PR positive, HER-2 negative invasive carcinoma of the upper-outer quadrant of the left breast: Patient underwent lumpectomy on June 21, 2013.  She did not require chemotherapy, but did receive adjuvant XRT.  Patient completed 5 years of letrozole in October 2020.  Previously extended treatment was discussed, but patient ultimately declined.  Patient's most recent mammogram on Dec 15, 2018 was reported as BI-RADS 1.  Repeat in May 2021.  Return to clinic in 1 year for routine evaluation. 2.  Osteopenia:  Patient's most recent bone mineral density on August 15, 2019 reported T score of -2.2 which is improved from 2 years prior where her bone density reported a T score of -2.5.  Hold Prolia and repeat bone mineral density in 1 year.  Patient will follow-up 1 to 2 days later for further evaluation and consideration of reinitiating Prolia if necessary. 3.  Dental cavities: Follow-up with dentistry and any interventions as needed.    Patient expressed understanding and was in agreement with this plan. She also understands that She can call clinic at any time with any questions, concerns, or complaints.   Cancer Staging Breast cancer of upper-outer quadrant of left female breast [facility]) Staging form: Breast, AJCC 7th Edition - Clinical stage from 06/21/2013: Stage IA (T1b, N0, M0) - Signed by Lloyd Huger, MD on 09/17/2019   Lloyd Huger, MD   09/19/2019 2:03 PM

## 2019-09-19 ENCOUNTER — Other Ambulatory Visit: Payer: Self-pay

## 2019-09-19 ENCOUNTER — Inpatient Hospital Stay (HOSPITAL_BASED_OUTPATIENT_CLINIC_OR_DEPARTMENT_OTHER): Payer: PPO | Admitting: Oncology

## 2019-09-19 ENCOUNTER — Encounter: Payer: Self-pay | Admitting: Oncology

## 2019-09-19 ENCOUNTER — Inpatient Hospital Stay: Payer: PPO

## 2019-09-19 ENCOUNTER — Inpatient Hospital Stay: Payer: PPO | Attending: Oncology

## 2019-09-19 VITALS — BP 157/83 | HR 91 | Temp 98.1°F | Wt 122.6 lb

## 2019-09-19 DIAGNOSIS — C50412 Malignant neoplasm of upper-outer quadrant of left female breast: Secondary | ICD-10-CM | POA: Diagnosis not present

## 2019-09-19 DIAGNOSIS — I10 Essential (primary) hypertension: Secondary | ICD-10-CM | POA: Diagnosis not present

## 2019-09-19 DIAGNOSIS — M810 Age-related osteoporosis without current pathological fracture: Secondary | ICD-10-CM | POA: Diagnosis not present

## 2019-09-19 DIAGNOSIS — K029 Dental caries, unspecified: Secondary | ICD-10-CM | POA: Insufficient documentation

## 2019-09-19 DIAGNOSIS — E7800 Pure hypercholesterolemia, unspecified: Secondary | ICD-10-CM | POA: Diagnosis not present

## 2019-09-19 DIAGNOSIS — Z9221 Personal history of antineoplastic chemotherapy: Secondary | ICD-10-CM | POA: Insufficient documentation

## 2019-09-19 DIAGNOSIS — Z79811 Long term (current) use of aromatase inhibitors: Secondary | ICD-10-CM | POA: Insufficient documentation

## 2019-09-19 DIAGNOSIS — Z801 Family history of malignant neoplasm of trachea, bronchus and lung: Secondary | ICD-10-CM | POA: Diagnosis not present

## 2019-09-19 DIAGNOSIS — Z170 Estrogen receptor positive status [ER+]: Secondary | ICD-10-CM | POA: Diagnosis not present

## 2019-09-19 DIAGNOSIS — Z923 Personal history of irradiation: Secondary | ICD-10-CM | POA: Insufficient documentation

## 2019-09-19 DIAGNOSIS — M8580 Other specified disorders of bone density and structure, unspecified site: Secondary | ICD-10-CM | POA: Insufficient documentation

## 2019-09-19 DIAGNOSIS — Z803 Family history of malignant neoplasm of breast: Secondary | ICD-10-CM | POA: Insufficient documentation

## 2019-09-19 DIAGNOSIS — Z79899 Other long term (current) drug therapy: Secondary | ICD-10-CM | POA: Insufficient documentation

## 2019-09-19 LAB — COMPREHENSIVE METABOLIC PANEL
ALT: 25 U/L (ref 0–44)
AST: 33 U/L (ref 15–41)
Albumin: 4.6 g/dL (ref 3.5–5.0)
Alkaline Phosphatase: 80 U/L (ref 38–126)
Anion gap: 10 (ref 5–15)
BUN: 9 mg/dL (ref 8–23)
CO2: 28 mmol/L (ref 22–32)
Calcium: 10.3 mg/dL (ref 8.9–10.3)
Chloride: 95 mmol/L — ABNORMAL LOW (ref 98–111)
Creatinine, Ser: 0.7 mg/dL (ref 0.44–1.00)
GFR calc Af Amer: >60 mL/min (ref >60)
GFR calc non Af Amer: >60 mL/min (ref >60)
Glucose, Bld: 114 mg/dL — ABNORMAL HIGH (ref 70–99)
Potassium: 4.2 mmol/L (ref 3.5–5.1)
Sodium: 133 mmol/L — ABNORMAL LOW (ref 135–145)
Total Bilirubin: 0.7 mg/dL (ref 0.3–1.2)
Total Protein: 7.7 g/dL (ref 6.5–8.1)

## 2019-09-19 LAB — CBC WITH DIFFERENTIAL/PLATELET
Abs Immature Granulocytes: 0.02 10*3/uL (ref 0.00–0.07)
Basophils Absolute: 0.1 10*3/uL (ref 0.0–0.1)
Basophils Relative: 1 %
Eosinophils Absolute: 0.1 10*3/uL (ref 0.0–0.5)
Eosinophils Relative: 1 %
HCT: 42.4 % (ref 36.0–46.0)
Hemoglobin: 14.1 g/dL (ref 12.0–15.0)
Immature Granulocytes: 0 %
Lymphocytes Relative: 21 %
Lymphs Abs: 1.5 10*3/uL (ref 0.7–4.0)
MCH: 30.8 pg (ref 26.0–34.0)
MCHC: 33.3 g/dL (ref 30.0–36.0)
MCV: 92.6 fL (ref 80.0–100.0)
Monocytes Absolute: 0.6 10*3/uL (ref 0.1–1.0)
Monocytes Relative: 8 %
Neutro Abs: 5 10*3/uL (ref 1.7–7.7)
Neutrophils Relative %: 69 %
Platelets: 310 10*3/uL (ref 150–400)
RBC: 4.58 MIL/uL (ref 3.87–5.11)
RDW: 13 % (ref 11.5–15.5)
WBC: 7.3 10*3/uL (ref 4.0–10.5)
nRBC: 0 % (ref 0.0–0.2)

## 2019-09-19 NOTE — Addendum Note (Signed)
Addended by: Vanice Sarah on: 09/19/2019 02:12 PM   Modules accepted: Orders

## 2019-09-19 NOTE — Progress Notes (Signed)
Patient is scheduled for her 2nd COVID vaccine on 09/27/19 and was advised no vaccines 2 weeks prior.  OK to proceed with Prolia today.  Also has 2 cavities she needs to get fixed.

## 2019-09-20 LAB — CANCER ANTIGEN 27.29: CA 27.29: 38.8 U/mL — ABNORMAL HIGH (ref 0.0–38.6)

## 2019-12-20 ENCOUNTER — Ambulatory Visit
Admission: RE | Admit: 2019-12-20 | Discharge: 2019-12-20 | Disposition: A | Discharge status: Home / Self Care | Payer: PPO | Source: Ambulatory Visit | Attending: Oncology | Admitting: Oncology

## 2019-12-20 DIAGNOSIS — Z1231 Encounter for screening mammogram for malignant neoplasm of breast: Secondary | ICD-10-CM | POA: Diagnosis not present

## 2019-12-20 DIAGNOSIS — Z170 Estrogen receptor positive status [ER+]: Secondary | ICD-10-CM

## 2020-01-24 DIAGNOSIS — E7800 Pure hypercholesterolemia, unspecified: Secondary | ICD-10-CM | POA: Diagnosis not present

## 2020-01-24 DIAGNOSIS — I10 Essential (primary) hypertension: Secondary | ICD-10-CM | POA: Diagnosis not present

## 2020-01-31 DIAGNOSIS — T451X5A Adverse effect of antineoplastic and immunosuppressive drugs, initial encounter: Secondary | ICD-10-CM | POA: Diagnosis not present

## 2020-01-31 DIAGNOSIS — I10 Essential (primary) hypertension: Secondary | ICD-10-CM | POA: Diagnosis not present

## 2020-01-31 DIAGNOSIS — Z0000 Encounter for general adult medical examination without abnormal findings: Secondary | ICD-10-CM | POA: Diagnosis not present

## 2020-01-31 DIAGNOSIS — G620 Drug-induced polyneuropathy: Secondary | ICD-10-CM | POA: Diagnosis not present

## 2020-01-31 DIAGNOSIS — E7800 Pure hypercholesterolemia, unspecified: Secondary | ICD-10-CM | POA: Diagnosis not present

## 2020-02-06 DIAGNOSIS — H2513 Age-related nuclear cataract, bilateral: Secondary | ICD-10-CM | POA: Diagnosis not present

## 2020-02-06 DIAGNOSIS — D3131 Benign neoplasm of right choroid: Secondary | ICD-10-CM | POA: Diagnosis not present

## 2020-07-27 DIAGNOSIS — E7800 Pure hypercholesterolemia, unspecified: Secondary | ICD-10-CM | POA: Diagnosis not present

## 2020-07-27 DIAGNOSIS — I10 Essential (primary) hypertension: Secondary | ICD-10-CM | POA: Diagnosis not present

## 2020-08-03 DIAGNOSIS — G620 Drug-induced polyneuropathy: Secondary | ICD-10-CM | POA: Diagnosis not present

## 2020-08-03 DIAGNOSIS — T451X5A Adverse effect of antineoplastic and immunosuppressive drugs, initial encounter: Secondary | ICD-10-CM | POA: Diagnosis not present

## 2020-08-03 DIAGNOSIS — E7800 Pure hypercholesterolemia, unspecified: Secondary | ICD-10-CM | POA: Diagnosis not present

## 2020-08-03 DIAGNOSIS — I10 Essential (primary) hypertension: Secondary | ICD-10-CM | POA: Diagnosis not present

## 2020-08-15 ENCOUNTER — Ambulatory Visit
Admission: RE | Admit: 2020-08-15 | Discharge: 2020-08-15 | Disposition: A | Discharge status: Home / Self Care | Payer: PPO | Source: Ambulatory Visit | Attending: Oncology | Admitting: Oncology

## 2020-08-15 ENCOUNTER — Other Ambulatory Visit: Payer: Self-pay

## 2020-08-15 DIAGNOSIS — C50412 Malignant neoplasm of upper-outer quadrant of left female breast: Secondary | ICD-10-CM | POA: Diagnosis not present

## 2020-08-15 DIAGNOSIS — Z8739 Personal history of other diseases of the musculoskeletal system and connective tissue: Secondary | ICD-10-CM | POA: Diagnosis not present

## 2020-08-15 DIAGNOSIS — Z803 Family history of malignant neoplasm of breast: Secondary | ICD-10-CM | POA: Diagnosis not present

## 2020-08-15 DIAGNOSIS — R29890 Loss of height: Secondary | ICD-10-CM | POA: Diagnosis not present

## 2020-08-15 DIAGNOSIS — Z170 Estrogen receptor positive status [ER+]: Secondary | ICD-10-CM | POA: Diagnosis not present

## 2020-08-15 DIAGNOSIS — M810 Age-related osteoporosis without current pathological fracture: Secondary | ICD-10-CM | POA: Insufficient documentation

## 2020-08-15 DIAGNOSIS — Z8262 Family history of osteoporosis: Secondary | ICD-10-CM | POA: Diagnosis not present

## 2020-09-15 NOTE — Progress Notes (Signed)
[place]  Telephone:(336) [phone] Fax:(336) [phone]  ID: Karrie Fluellen Lazo OB: [birth year]  MR#: [id number]  NIO#:[id number]  Patient Care Team: Kirk Ruths, MD as PCP - General (Internal Medicine)  CHIEF COMPLAINT: Stage IA ER/PR positive, HER-2 negative invasive carcinoma of the upper-outer quadrant of the left breast.  INTERVAL HISTORY: Patient returns to clinic today for routine 59-monthevaluation and continuation of Prolia.  She continues to feel well and is asymptomatic. She has no neurologic complaints.  She denies any recent fevers or illnesses.  She has a good appetite and denies weight loss.  She denies any chest pain, shortness of breath, cough, or hemoptysis.  She denies any nausea, vomiting, constipation, or diarrhea.  She has no urinary complaints.  Patient offers no specific complaints today.   REVIEW OF SYSTEMS:   Review of Systems  Constitutional: Negative.  Negative for fever, malaise/fatigue and weight loss.  Respiratory: Negative.  Negative for cough, hemoptysis and shortness of breath.   Cardiovascular: Negative.  Negative for chest pain and leg swelling.  Gastrointestinal: Negative.  Negative for abdominal pain, blood in stool and melena.  Genitourinary: Negative.  Negative for dysuria.  Musculoskeletal: Negative.  Negative for back pain.  Skin: Negative.  Negative for rash.  Neurological: Negative.  Negative for focal weakness, weakness and headaches.  Psychiatric/Behavioral: Negative.  The patient is not nervous/anxious.     As per HPI. Otherwise, a complete review of systems is negative.  PAST MEDICAL HISTORY: Past Medical History:  Diagnosis Date  . Breast cancer [place]   left breast/radiation and chemo  . [place] cancer, [place] breast ([facility]   . History of bone density study 06/2014  . History of colonoscopy 2012  . History of mammogram 06/2014  . History of Papanicolaou smear of cervix 2013  . Hypercholesteremia   .  Hypertension   . Personal history of chemotherapy   . Personal history of radiation therapy     PAST SURGICAL HISTORY: Past Surgical History:  Procedure Laterality Date  . BREAST BIOPSY Left 06/01/2013   invasive mammary carcinoma  . BREAST LUMPECTOMY Left 06/21/2013   grade II invasive mammary carcinoma, clear margins, LN negative  . PORT-A-CATH REMOVAL  11/15/2014  . PORTACATH PLACEMENT    . TUBAL LIGATION      FAMILY HISTORY: Family History  Problem Relation Age of Onset  . Breast cancer Cousin 473 . Cancer Paternal Uncle        lung  . Breast cancer Paternal Aunt 451   ADVANCED DIRECTIVES (Y/N):  N  HEALTH MAINTENANCE: Social History   Tobacco Use  . Smoking status: Never Smoker  . Smokeless tobacco: Never Used  Substance Use Topics  . Alcohol use: No    Alcohol/week: 0.0 standard drinks  . Drug use: No     Colonoscopy:  PAP:  Bone density:  Lipid panel:  Allergies  Allergen Reactions  . Ace Inhibitors Cough and Other (See Comments)  . Lisinopril     Other reaction(s): Cough    Current Outpatient Medications  Medication Sig Dispense Refill  . amLODipine (NORVASC) 2.5 MG tablet 5 mg.  6  . Calcium 600-200 MG-UNIT per tablet Take 2 tablets by mouth daily.    .Marland Kitchengabapentin (NEURONTIN) 100 MG capsule Take by mouth.    . Omega-3 Fatty Acids (FISH OIL) 1000 MG CAPS Take 1 capsule by mouth daily.    . pravastatin (PRAVACHOL) 40 MG tablet Take 40 mg by mouth daily.  0  . hydrochlorothiazide (MICROZIDE) 12.5 MG capsule Take 12.5 mg by mouth daily. Reported on 11/09/2015 (Patient not taking: Reported on 09/20/2020)  0  . letrozole ([place]) 2.5 MG tablet TAKE 1 TABLET BY MOUTH ONCE DAILY (Patient not taking: No sig reported) 90 tablet 3   No current facility-administered medications for this visit.    OBJECTIVE: Vitals:   09/20/20 1045  BP: 137/76  Pulse: 90  Resp: 16  Temp: 98 F (36.7 C)  SpO2: 100%     Body mass index is 23.42 kg/m.    ECOG FS:0 -  Asymptomatic  General: Thin, no acute distress. Eyes: Pink conjunctiva, anicteric sclera. HEENT: Normocephalic, moist mucous membranes. Lungs: No audible wheezing or coughing. Heart: Regular rate and rhythm. Abdomen: Soft, nontender, no obvious distention. Musculoskeletal: No edema, cyanosis, or clubbing. Neuro: Alert, answering all questions appropriately. Cranial nerves grossly intact. Skin: No rashes or petechiae noted. Psych: Normal affect.   LAB RESULTS:  Lab Results  Component Value Date   NA 135 09/20/2020   K 4.6 09/20/2020   CL 95 (L) 09/20/2020   CO2 28 09/20/2020   GLUCOSE 103 (H) 09/20/2020   BUN 11 09/20/2020   CREATININE 0.63 09/20/2020   CALCIUM 9.6 09/20/2020   PROT 7.1 09/20/2020   ALBUMIN 4.6 09/20/2020   AST 44 (H) 09/20/2020   ALT 30 09/20/2020   ALKPHOS 126 09/20/2020   BILITOT 0.8 09/20/2020   GFRNONAA >60 09/20/2020   GFRAA >60 09/19/2019    Lab Results  Component Value Date   WBC 7.4 09/20/2020   NEUTROABS 5.2 09/20/2020   HGB 13.7 09/20/2020   HCT 40.7 09/20/2020   MCV 91.3 09/20/2020   PLT 298 09/20/2020     STUDIES: No results found.  ASSESSMENT: Stage IA ER/PR positive, HER-2 negative invasive carcinoma of the upper-outer quadrant of the left breast.  PLAN:    1. Stage IA ER/PR positive, HER-2 negative invasive carcinoma of the upper-outer quadrant of the left breast: Patient underwent lumpectomy on June 21, 2013.  She did not require chemotherapy, but did receive adjuvant XRT.  Patient completed 5 years of letrozole in October 2020.  Previously extended treatment was discussed, but patient ultimately declined.  Her most recent mammogram on December 20, 2019 was reported BI-RADS 1.  Repeat in June 2022.   2.  Osteopenia: Patient's most recent bone mineral density on August 15, 2020 reported T score of -2.6 which is slightly worse than 1 year prior where her T score was reported -2.2.  Proceed with Prolia today.  Patient has been  instructed to continue calcium and vitamin D supplementation.  Return to clinic in 6 months for repeat laboratory work and continuation of treatment.  Repeat bone mineral density in January 2023.   3.  Dental cavities: Follow-up with dentistry and any interventions as needed.    I spent a total of 20 minutes reviewing chart data, face-to-face evaluation with the patient, counseling and coordination of care as detailed above.   Patient expressed understanding and was in agreement with this plan. She also understands that She can call clinic at any time with any questions, concerns, or complaints.   Cancer Staging Breast cancer of upper-outer quadrant of left female breast [facility]) Staging form: Breast, AJCC 7th Edition - Clinical stage from 06/21/2013: Stage IA (T1b, N0, M0) - Signed by Lloyd Huger, MD on 09/17/2019 Laterality: Left Tumor size (mm): 7 Method of lymph node assessment: Sentinel lymph node biopsy Histologic grade (G): G2  Histologic grading system: 3 grade system Lymph-vascular invasion (LVI): LVI not present (absent)/not identified Residual tumor (R): R0 - None Paget's disease: Negative Tumor grade (Scarff-Bloom-Richardson system): G2 Estrogen receptor status: Positive Estrogen receptor test method: IHC Intensity of estrogen receptor staining: Strong Percentage of positive estrogen receptors (%): 90 Progesterone receptor status: Positive Progesterone receptor test method: IHC Intensity of progesterone receptor staining: Weak Percentage of positive progesterone receptors (%): 1 HER2 status: Negative IHC of regional lymph nodes: Negative Results for molecular studies of regional lymph nodes: Negative Circulating tumor cells (CTC): Not assessed Disseminated tumor cells: Not assessed Multi-gene signature risk of recurrence: Intermediate risk Multi-gene signature score: 21   Lloyd Huger, MD   09/22/2020 7:10 AM

## 2020-09-19 ENCOUNTER — Other Ambulatory Visit: Payer: Self-pay | Admitting: *Deleted

## 2020-09-19 DIAGNOSIS — C50412 Malignant neoplasm of upper-outer quadrant of left female breast: Secondary | ICD-10-CM

## 2020-09-19 DIAGNOSIS — Z170 Estrogen receptor positive status [ER+]: Secondary | ICD-10-CM

## 2020-09-20 ENCOUNTER — Inpatient Hospital Stay: Payer: PPO

## 2020-09-20 ENCOUNTER — Encounter: Payer: Self-pay | Admitting: Oncology

## 2020-09-20 ENCOUNTER — Inpatient Hospital Stay: Payer: PPO | Attending: Oncology

## 2020-09-20 ENCOUNTER — Inpatient Hospital Stay (HOSPITAL_BASED_OUTPATIENT_CLINIC_OR_DEPARTMENT_OTHER): Payer: PPO | Admitting: Oncology

## 2020-09-20 ENCOUNTER — Other Ambulatory Visit: Payer: Self-pay

## 2020-09-20 VITALS — BP 137/76 | HR 90 | Temp 98.0°F | Resp 16 | Wt 119.9 lb

## 2020-09-20 DIAGNOSIS — Z79811 Long term (current) use of aromatase inhibitors: Secondary | ICD-10-CM | POA: Diagnosis not present

## 2020-09-20 DIAGNOSIS — Z9221 Personal history of antineoplastic chemotherapy: Secondary | ICD-10-CM | POA: Diagnosis not present

## 2020-09-20 DIAGNOSIS — M810 Age-related osteoporosis without current pathological fracture: Secondary | ICD-10-CM

## 2020-09-20 DIAGNOSIS — C50412 Malignant neoplasm of upper-outer quadrant of left female breast: Secondary | ICD-10-CM | POA: Insufficient documentation

## 2020-09-20 DIAGNOSIS — Z803 Family history of malignant neoplasm of breast: Secondary | ICD-10-CM | POA: Insufficient documentation

## 2020-09-20 DIAGNOSIS — Z923 Personal history of irradiation: Secondary | ICD-10-CM | POA: Diagnosis not present

## 2020-09-20 DIAGNOSIS — Z79899 Other long term (current) drug therapy: Secondary | ICD-10-CM | POA: Diagnosis not present

## 2020-09-20 DIAGNOSIS — Z801 Family history of malignant neoplasm of trachea, bronchus and lung: Secondary | ICD-10-CM | POA: Diagnosis not present

## 2020-09-20 DIAGNOSIS — I10 Essential (primary) hypertension: Secondary | ICD-10-CM | POA: Diagnosis not present

## 2020-09-20 DIAGNOSIS — Z170 Estrogen receptor positive status [ER+]: Secondary | ICD-10-CM | POA: Insufficient documentation

## 2020-09-20 LAB — CBC WITH DIFFERENTIAL/PLATELET
Abs Immature Granulocytes: 0.02 10*3/uL (ref 0.00–0.07)
Basophils Absolute: 0.1 10*3/uL (ref 0.0–0.1)
Basophils Relative: 1 %
Eosinophils Absolute: 0.1 10*3/uL (ref 0.0–0.5)
Eosinophils Relative: 1 %
HCT: 40.7 % (ref 36.0–46.0)
Hemoglobin: 13.7 g/dL (ref 12.0–15.0)
Immature Granulocytes: 0 %
Lymphocytes Relative: 19 %
Lymphs Abs: 1.4 10*3/uL (ref 0.7–4.0)
MCH: 30.7 pg (ref 26.0–34.0)
MCHC: 33.7 g/dL (ref 30.0–36.0)
MCV: 91.3 fL (ref 80.0–100.0)
Monocytes Absolute: 0.7 10*3/uL (ref 0.1–1.0)
Monocytes Relative: 9 %
Neutro Abs: 5.2 10*3/uL (ref 1.7–7.7)
Neutrophils Relative %: 70 %
Platelets: 298 10*3/uL (ref 150–400)
RBC: 4.46 MIL/uL (ref 3.87–5.11)
RDW: 13.6 % (ref 11.5–15.5)
WBC: 7.4 10*3/uL (ref 4.0–10.5)
nRBC: 0 % (ref 0.0–0.2)

## 2020-09-20 LAB — COMPREHENSIVE METABOLIC PANEL
ALT: 30 U/L (ref 0–44)
AST: 44 U/L — ABNORMAL HIGH (ref 15–41)
Albumin: 4.6 g/dL (ref 3.5–5.0)
Alkaline Phosphatase: 126 U/L (ref 38–126)
Anion gap: 12 (ref 5–15)
BUN: 11 mg/dL (ref 8–23)
CO2: 28 mmol/L (ref 22–32)
Calcium: 9.6 mg/dL (ref 8.9–10.3)
Chloride: 95 mmol/L — ABNORMAL LOW (ref 98–111)
Creatinine, Ser: 0.63 mg/dL (ref 0.44–1.00)
GFR, Estimated: >60 mL/min (ref >60)
Glucose, Bld: 103 mg/dL — ABNORMAL HIGH (ref 70–99)
Potassium: 4.6 mmol/L (ref 3.5–5.1)
Sodium: 135 mmol/L (ref 135–145)
Total Bilirubin: 0.8 mg/dL (ref 0.3–1.2)
Total Protein: 7.1 g/dL (ref 6.5–8.1)

## 2020-09-20 MED ORDER — DENOSUMAB 60 MG/ML ~~LOC~~ SOSY
60.0000 mg | PREFILLED_SYRINGE | Freq: Once | SUBCUTANEOUS | Status: AC
  Administered 2020-09-20: 60 mg via SUBCUTANEOUS
  Filled 2020-09-20: qty 1

## 2020-09-20 NOTE — Progress Notes (Signed)
Patient here for oncology follow-up appointment, expresses no complaints or concerns at this time.    

## 2020-09-21 LAB — CANCER ANTIGEN 27.29: CA 27.29: 34 U/mL (ref 0.0–38.6)

## 2021-01-24 DIAGNOSIS — I10 Essential (primary) hypertension: Secondary | ICD-10-CM | POA: Diagnosis not present

## 2021-01-24 DIAGNOSIS — E7800 Pure hypercholesterolemia, unspecified: Secondary | ICD-10-CM | POA: Diagnosis not present

## 2021-01-31 DIAGNOSIS — Z0000 Encounter for general adult medical examination without abnormal findings: Secondary | ICD-10-CM | POA: Diagnosis not present

## 2021-01-31 DIAGNOSIS — M8580 Other specified disorders of bone density and structure, unspecified site: Secondary | ICD-10-CM | POA: Insufficient documentation

## 2021-01-31 DIAGNOSIS — I10 Essential (primary) hypertension: Secondary | ICD-10-CM | POA: Diagnosis not present

## 2021-01-31 DIAGNOSIS — Z1231 Encounter for screening mammogram for malignant neoplasm of breast: Secondary | ICD-10-CM | POA: Diagnosis not present

## 2021-01-31 DIAGNOSIS — E7800 Pure hypercholesterolemia, unspecified: Secondary | ICD-10-CM | POA: Diagnosis not present

## 2021-02-01 ENCOUNTER — Other Ambulatory Visit: Payer: Self-pay | Admitting: Internal Medicine

## 2021-02-01 DIAGNOSIS — Z1231 Encounter for screening mammogram for malignant neoplasm of breast: Secondary | ICD-10-CM

## 2021-02-15 ENCOUNTER — Other Ambulatory Visit: Payer: Self-pay

## 2021-02-15 ENCOUNTER — Ambulatory Visit
Admission: RE | Admit: 2021-02-15 | Discharge: 2021-02-15 | Disposition: A | Discharge status: Home / Self Care | Payer: PPO | Source: Ambulatory Visit | Attending: Internal Medicine | Admitting: Internal Medicine

## 2021-02-15 DIAGNOSIS — Z1231 Encounter for screening mammogram for malignant neoplasm of breast: Secondary | ICD-10-CM | POA: Diagnosis not present

## 2021-03-28 ENCOUNTER — Inpatient Hospital Stay: Payer: PPO | Attending: Oncology | Admitting: Nurse Practitioner

## 2021-03-28 ENCOUNTER — Inpatient Hospital Stay: Payer: PPO

## 2021-03-28 ENCOUNTER — Other Ambulatory Visit: Payer: Self-pay

## 2021-03-28 ENCOUNTER — Encounter: Payer: Self-pay | Admitting: Nurse Practitioner

## 2021-03-28 VITALS — BP 137/72 | HR 81 | Temp 98.1°F | Resp 16 | Wt 115.0 lb

## 2021-03-28 DIAGNOSIS — C50412 Malignant neoplasm of upper-outer quadrant of left female breast: Secondary | ICD-10-CM

## 2021-03-28 DIAGNOSIS — M8580 Other specified disorders of bone density and structure, unspecified site: Secondary | ICD-10-CM | POA: Insufficient documentation

## 2021-03-28 DIAGNOSIS — Z853 Personal history of malignant neoplasm of breast: Secondary | ICD-10-CM

## 2021-03-28 DIAGNOSIS — M810 Age-related osteoporosis without current pathological fracture: Secondary | ICD-10-CM

## 2021-03-28 DIAGNOSIS — Z08 Encounter for follow-up examination after completed treatment for malignant neoplasm: Secondary | ICD-10-CM

## 2021-03-28 DIAGNOSIS — Z170 Estrogen receptor positive status [ER+]: Secondary | ICD-10-CM

## 2021-03-28 LAB — COMPREHENSIVE METABOLIC PANEL
ALT: 25 U/L (ref 0–44)
AST: 34 U/L (ref 15–41)
Albumin: 4.4 g/dL (ref 3.5–5.0)
Alkaline Phosphatase: 85 U/L (ref 38–126)
Anion gap: 6 (ref 5–15)
BUN: 10 mg/dL (ref 8–23)
CO2: 30 mmol/L (ref 22–32)
Calcium: 9.3 mg/dL (ref 8.9–10.3)
Chloride: 96 mmol/L — ABNORMAL LOW (ref 98–111)
Creatinine, Ser: 0.64 mg/dL (ref 0.44–1.00)
GFR, Estimated: >60 mL/min (ref >60)
Glucose, Bld: 104 mg/dL — ABNORMAL HIGH (ref 70–99)
Potassium: 4.4 mmol/L (ref 3.5–5.1)
Sodium: 132 mmol/L — ABNORMAL LOW (ref 135–145)
Total Bilirubin: 0.7 mg/dL (ref 0.3–1.2)
Total Protein: 7.2 g/dL (ref 6.5–8.1)

## 2021-03-28 LAB — CBC WITH DIFFERENTIAL/PLATELET
Abs Immature Granulocytes: 0.02 10*3/uL (ref 0.00–0.07)
Basophils Absolute: 0 10*3/uL (ref 0.0–0.1)
Basophils Relative: 1 %
Eosinophils Absolute: 0 10*3/uL (ref 0.0–0.5)
Eosinophils Relative: 1 %
HCT: 39.1 % (ref 36.0–46.0)
Hemoglobin: 13.5 g/dL (ref 12.0–15.0)
Immature Granulocytes: 0 %
Lymphocytes Relative: 18 %
Lymphs Abs: 1.1 10*3/uL (ref 0.7–4.0)
MCH: 31.8 pg (ref 26.0–34.0)
MCHC: 34.5 g/dL (ref 30.0–36.0)
MCV: 92 fL (ref 80.0–100.0)
Monocytes Absolute: 0.6 10*3/uL (ref 0.1–1.0)
Monocytes Relative: 10 %
Neutro Abs: 4.3 10*3/uL (ref 1.7–7.7)
Neutrophils Relative %: 70 %
Platelets: 282 10*3/uL (ref 150–400)
RBC: 4.25 MIL/uL (ref 3.87–5.11)
RDW: 13.2 % (ref 11.5–15.5)
WBC: 6.2 10*3/uL (ref 4.0–10.5)
nRBC: 0 % (ref 0.0–0.2)

## 2021-03-28 MED ORDER — DENOSUMAB 60 MG/ML ~~LOC~~ SOSY
60.0000 mg | PREFILLED_SYRINGE | Freq: Once | SUBCUTANEOUS | Status: AC
  Administered 2021-03-28: 60 mg via SUBCUTANEOUS
  Filled 2021-03-28: qty 1

## 2021-03-28 NOTE — Progress Notes (Signed)
[place]  Telephone:([phone] Fax:(336) [phone]  ID: [name] OB: [birth year]  MR#: [id number]  DJM#:[id number]  Patient Care Team: Kirk Ruths, MD as PCP - General (Internal Medicine)  CHIEF COMPLAINT: Stage IA ER/PR positive, HER-2 negative invasive carcinoma of the upper-outer quadrant of the left breast.  INTERVAL HISTORY: Patient returns to clinic for routine 74-monthevaluation, labs, and consideration of Prolia.  She continues to feel well and is asymptomatic of recurrence of her breast cancer.  She is off aromatase inhibitors.  No recent fevers or illness.  Her appetite is good and she denies weight loss.  No chest pain, shortness of breath, cough, hemoptysis.  No new lumps or bumps.  No nausea, vomiting, constipation, diarrhea.  No urinary complaints.  No further specific complaints today.   REVIEW OF SYSTEMS:   Review of Systems  Constitutional: Negative.  Negative for fever, malaise/fatigue and weight loss.  Respiratory: Negative.  Negative for cough, hemoptysis and shortness of breath.   Cardiovascular: Negative.  Negative for chest pain and leg swelling.  Gastrointestinal: Negative.  Negative for abdominal pain, blood in stool and melena.  Genitourinary: Negative.  Negative for dysuria.  Musculoskeletal: Negative.  Negative for back pain.  Skin: Negative.  Negative for rash.  Neurological: Negative.  Negative for focal weakness, weakness and headaches.  Psychiatric/Behavioral: Negative.  The patient is not nervous/anxious.   As per HPI. Otherwise, a complete review of systems is negative.  PAST MEDICAL HISTORY: Past Medical History:  Diagnosis Date   Breast cancer ([facility] 2014   left breast/radiation and chemo   [place]    History of bone density study 06/2014   History of colonoscopy 2012   History of mammogram 06/2014   History of Papanicolaou smear of cervix 2013   Hypercholesteremia    Hypertension     Personal history of chemotherapy    Personal history of radiation therapy     PAST SURGICAL HISTORY: Past Surgical History:  Procedure Laterality Date   BREAST BIOPSY Left 06/01/2013   invasive mammary carcinoma   BREAST LUMPECTOMY Left 06/21/2013   grade II invasive mammary carcinoma, clear margins, LN negative   PORT-A-CATH REMOVAL  11/15/2014   PORTACATH PLACEMENT     TUBAL LIGATION      FAMILY HISTORY: Family History  Problem Relation Age of Onset   Breast cancer Cousin 466  Cancer Paternal Uncle        lung   Breast cancer Paternal Aunt 483   ADVANCED DIRECTIVES (Y/N):  N  HEALTH MAINTENANCE: Social History   Tobacco Use   Smoking status: Never   Smokeless tobacco: Never  Substance Use Topics   Alcohol use: No    Alcohol/week: 0.0 standard drinks   Drug use: No    Colonoscopy:  PAP:  Bone density:  Lipid panel:  Allergies  Allergen Reactions   Ace Inhibitors Cough and Other (See Comments)   Lisinopril     Other reaction(s): Cough    Current Outpatient Medications  Medication Sig Dispense Refill   amLODipine (NORVASC) 5 MG tablet Take 5 mg by mouth daily.     Calcium 600-200 MG-UNIT per tablet Take 2 tablets by mouth daily.     gabapentin (NEURONTIN) 100 MG capsule Take 100 mg by mouth 3 (three) times daily.     Omega-3 Fatty Acids (FISH OIL) 1000 MG CAPS Take 1 capsule by mouth daily.     pravastatin (PRAVACHOL) 40 MG tablet  Take 40 mg by mouth daily.  0   No current facility-administered medications for this visit.    OBJECTIVE: Vitals:   03/28/21 1020  BP: 137/72  Pulse: 81  Resp: 16  Temp: 98.1 F (36.7 C)     Body mass index is 22.46 kg/m.    ECOG FS:0 - Asymptomatic  General: Well-developed, well-nourished, no acute distress. Eyes: Pink conjunctiva, anicteric sclera. Lungs: Clear to auscultation bilaterally.  No audible wheezing or coughing Heart: Regular rate and rhythm.  Abdomen: Soft, nontender, nondistended.  Musculoskeletal:  No edema, cyanosis, or clubbing. Neuro: Alert, answering all questions appropriately. Cranial nerves grossly intact. Skin: No rashes or petechiae noted. Psych: Normal affect.  LAB RESULTS:  Lab Results  Component Value Date   NA 132 (L) 03/28/2021   K 4.4 03/28/2021   CL 96 (L) 03/28/2021   CO2 30 03/28/2021   GLUCOSE 104 (H) 03/28/2021   BUN 10 03/28/2021   CREATININE 0.64 03/28/2021   CALCIUM 9.3 03/28/2021   PROT 7.2 03/28/2021   ALBUMIN 4.4 03/28/2021   AST 34 03/28/2021   ALT 25 03/28/2021   ALKPHOS 85 03/28/2021   BILITOT 0.7 03/28/2021   GFRNONAA >60 03/28/2021   GFRAA >60 09/19/2019    Lab Results  Component Value Date   WBC 6.2 03/28/2021   NEUTROABS 4.3 03/28/2021   HGB 13.5 03/28/2021   HCT 39.1 03/28/2021   MCV 92.0 03/28/2021   PLT 282 03/28/2021     STUDIES: No results found.  ASSESSMENT: Stage IA ER/PR positive, HER-2 negative invasive carcinoma of the upper-outer quadrant of the left breast.  PLAN:    1. Stage IA ER/PR positive, HER-2 negative invasive carcinoma of the upper-outer quadrant of the left breast: s/p lumpectomy on 06/21/2013.  Received adjuvant XRT.  Adjuvant chemotherapy not indicated.  She completed 5 years of letrozole in October 2020.  She declined extended therapy.  Most recent mammogram in August 2022 was negative for recurrent or new malignancy.  Plan to repeat annually.  She says she will have her PCP schedule.  She continues self breast exams.  Clinically no evidence of recurrence today.  Consider releasing from clinic in December 2024  2.  Osteopenia-most recent bone density scan in January 2022 showed T score of -2.4 which is improved from previous.  Continue Prolia.  Tolerating well without significant side effects continue calcium and vitamin D supplementation she is participating in weightbearing exercise which I encouraged and applauded today.  Labs today reviewed and acceptable for Prolia.  3.  Dental cavities-managed by  dentist.  Have now resolved post intervention  Disposition: Return to clinic in 6 months for labs, follow-up with Dr. Grayland Ormond, consideration of Prolia  I spent a total of 20 minutes reviewing chart data, face-to-face evaluation with the patient, counseling and coordination of care as detailed above.  Patient expressed understanding and was in agreement with this plan. She also understands that She can call clinic at any time with any questions, concerns, or complaints.   Cancer Staging Breast cancer of upper-outer quadrant of left female breast [facility]) Staging form: Breast, AJCC 7th Edition - Clinical stage from 06/21/2013: Stage IA (T1b, N0, M0) - Signed by Lloyd Huger, MD on 09/17/2019 Laterality: Left Tumor size (mm): 7 Method of lymph node assessment: Sentinel lymph node biopsy Histologic grade (G): G2 Histologic grading system: 3 grade system Lymph-vascular invasion (LVI): LVI not present (absent)/not identified Residual tumor (R): R0 - None Paget's disease: Negative Tumor grade (Scarff-Bloom-Richardson system):  G2 Estrogen receptor status: Positive Estrogen receptor test method: IHC Intensity of estrogen receptor staining: Strong Percentage of positive estrogen receptors (%): 90 Progesterone receptor status: Positive Progesterone receptor test method: IHC Intensity of progesterone receptor staining: Weak Percentage of positive progesterone receptors (%): 1 HER2 status: Negative IHC of regional lymph nodes: Negative Results for molecular studies of regional lymph nodes: Negative Circulating tumor cells (CTC): Not assessed Disseminated tumor cells: Not assessed Multi-gene signature risk of recurrence: Intermediate risk Multi-gene signature score: 21   Raven Au, NP   03/28/2021 12:11 PM  CC: Dr. Ouida Sills

## 2021-03-28 NOTE — Progress Notes (Signed)
No breast concerns. She still has neuropathy on her balls of feet and her fingers.

## 2021-03-29 ENCOUNTER — Other Ambulatory Visit: Payer: PPO

## 2021-03-29 ENCOUNTER — Ambulatory Visit: Payer: PPO | Admitting: Oncology

## 2021-03-29 ENCOUNTER — Ambulatory Visit: Payer: PPO

## 2021-03-29 LAB — CANCER ANTIGEN 27.29: CA 27.29: 32.1 U/mL (ref 0.0–38.6)

## 2021-05-13 DIAGNOSIS — H2513 Age-related nuclear cataract, bilateral: Secondary | ICD-10-CM | POA: Diagnosis not present

## 2021-07-29 DIAGNOSIS — I10 Essential (primary) hypertension: Secondary | ICD-10-CM | POA: Diagnosis not present

## 2021-07-29 DIAGNOSIS — E7800 Pure hypercholesterolemia, unspecified: Secondary | ICD-10-CM | POA: Diagnosis not present

## 2021-08-05 DIAGNOSIS — E7800 Pure hypercholesterolemia, unspecified: Secondary | ICD-10-CM | POA: Diagnosis not present

## 2021-08-05 DIAGNOSIS — I10 Essential (primary) hypertension: Secondary | ICD-10-CM | POA: Diagnosis not present

## 2021-08-05 DIAGNOSIS — G620 Drug-induced polyneuropathy: Secondary | ICD-10-CM | POA: Diagnosis not present

## 2021-08-05 DIAGNOSIS — T451X5A Adverse effect of antineoplastic and immunosuppressive drugs, initial encounter: Secondary | ICD-10-CM | POA: Diagnosis not present

## 2021-09-19 ENCOUNTER — Other Ambulatory Visit: Payer: Self-pay | Admitting: *Deleted

## 2021-09-19 DIAGNOSIS — C50412 Malignant neoplasm of upper-outer quadrant of left female breast: Secondary | ICD-10-CM

## 2021-09-19 DIAGNOSIS — Z170 Estrogen receptor positive status [ER+]: Secondary | ICD-10-CM

## 2021-09-19 DIAGNOSIS — M810 Age-related osteoporosis without current pathological fracture: Secondary | ICD-10-CM

## 2021-09-19 NOTE — Progress Notes (Signed)
?[place]  ?Telephone:(336) [phone] [phone] ? ?IDRitta Hammes Barber OB: 12/26/1950  MR#: [id number]  GUR#:[id number] ? ?Patient Care Team: ?Kirk Ruths, MD as PCP - General (Internal Medicine) ? ?CHIEF COMPLAINT: Stage IA ER/PR positive, HER-2 negative invasive carcinoma of the upper-outer quadrant of the left breast. ? ?INTERVAL HISTORY: Patient returns to clinic today for routine 72-monthevaluation and continuation of Prolia.  She currently feels well and is asymptomatic. She has no neurologic complaints.  She denies any recent fevers or illnesses.  She has a good appetite and denies weight loss.  She denies any chest pain, shortness of breath, cough, or hemoptysis.  She denies any nausea, vomiting, constipation, or diarrhea.  She has no urinary complaints.  Patient feels at her baseline offers no specific complaints today. ? ?REVIEW OF SYSTEMS:   ?Review of Systems  ?Constitutional: Negative.  Negative for fever, malaise/fatigue and weight loss.  ?Respiratory: Negative.  Negative for cough, hemoptysis and shortness of breath.   ?Cardiovascular: Negative.  Negative for chest pain and leg swelling.  ?Gastrointestinal: Negative.  Negative for abdominal pain, blood in stool and melena.  ?Genitourinary: Negative.  Negative for dysuria.  ?Musculoskeletal: Negative.  Negative for back pain.  ?Skin: Negative.  Negative for rash.  ?Neurological: Negative.  Negative for focal weakness, weakness and headaches.  ?Psychiatric/Behavioral: Negative.  The patient is not nervous/anxious.   ? ?As per HPI. Otherwise, a complete review of systems is negative. ? ?PAST MEDICAL HISTORY: ?Past Medical History:  ?Diagnosis Date  ? [place]  ? left breast/radiation and chemo  ? [place]   ? History of bone density study 06/2014  ? History of colonoscopy 2012  ? History of mammogram 06/2014  ? History of Papanicolaou smear of cervix 2013  ? Hypercholesteremia    ? Hypertension   ? Personal history of chemotherapy   ? Personal history of radiation therapy   ? ? ?PAST SURGICAL HISTORY: ?Past Surgical History:  ?Procedure Laterality Date  ? BREAST BIOPSY Left 06/01/2013  ? invasive mammary carcinoma  ? BREAST LUMPECTOMY Left 06/21/2013  ? grade II invasive mammary carcinoma, clear margins, LN negative  ? PORT-A-CATH REMOVAL  11/15/2014  ? PORTACATH PLACEMENT    ? TUBAL LIGATION    ? ? ?FAMILY HISTORY: ?Family History  ?Problem Relation Age of Onset  ? Breast cancer Cousin 450 ? Cancer Paternal Uncle   ?     lung  ? Breast cancer Paternal Aunt 474 ? ? ?ADVANCED DIRECTIVES (Y/N):  N ? ?HEALTH MAINTENANCE: ?Social History  ? ?Tobacco Use  ? Smoking status: Never  ? Smokeless tobacco: Never  ?Substance Use Topics  ? Alcohol use: No  ?  Alcohol/week: 0.0 standard drinks  ? Drug use: No  ? ? ? Colonoscopy: ? PAP: ? Bone density: ? Lipid panel: ? ?Allergies  ?Allergen Reactions  ? Ace Inhibitors Cough and Other (See Comments)  ? Lisinopril   ?  Other reaction(s): Cough  ? ? ?Current Outpatient Medications  ?Medication Sig Dispense Refill  ? amLODipine (NORVASC) 5 MG tablet Take 5 mg by mouth daily.    ? Calcium 600-200 MG-UNIT per tablet Take 2 tablets by mouth daily.    ? gabapentin (NEURONTIN) 100 MG capsule Take 100 mg by mouth 3 (three) times daily.    ? Omega-3 Fatty Acids (FISH OIL) 1000 MG CAPS Take 1 capsule by mouth daily.    ? pravastatin (PRAVACHOL) 40  MG tablet Take 40 mg by mouth daily.  0  ? ?No current facility-administered medications for this visit.  ? ? ?OBJECTIVE: ?Vitals:  ? 09/26/21 1046  ?BP: (!) 125/99  ?Pulse: 98  ?Temp: 99.6 ?F (37.6 ?C)  ?   Body mass index is 22.87 kg/m?Marland Kitchen    ECOG FS:0 - Asymptomatic ? ?General: Thin, no acute distress. ?Eyes: Pink conjunctiva, anicteric sclera. ?HEENT: Normocephalic, moist mucous membranes. ?Lungs: No audible wheezing or coughing. ?Heart: Regular rate and rhythm. ?Abdomen: Soft, nontender, no obvious  distention. ?Musculoskeletal: No edema, cyanosis, or clubbing. ?Neuro: Alert, answering all questions appropriately. Cranial nerves grossly intact. ?Skin: No rashes or petechiae noted. ?Psych: Normal affect. ? ?LAB RESULTS: ? ?Lab Results  ?Component Value Date  ? NA 132 (L) 09/26/2021  ? K 4.4 09/26/2021  ? CL 96 (L) 09/26/2021  ? CO2 27 09/26/2021  ? GLUCOSE 105 (H) 09/26/2021  ? BUN 10 09/26/2021  ? CREATININE 0.66 09/26/2021  ? CALCIUM 9.4 09/26/2021  ? PROT 7.2 09/26/2021  ? ALBUMIN 4.5 09/26/2021  ? AST 36 09/26/2021  ? ALT 24 09/26/2021  ? ALKPHOS 83 09/26/2021  ? BILITOT 0.7 09/26/2021  ? GFRNONAA >60 09/26/2021  ? GFRAA >60 09/19/2019  ? ? ?Lab Results  ?Component Value Date  ? WBC 6.8 09/26/2021  ? NEUTROABS 4.5 09/26/2021  ? HGB 14.0 09/26/2021  ? HCT 41.4 09/26/2021  ? MCV 91.4 09/26/2021  ? PLT 278 09/26/2021  ? ? ? ?STUDIES: ?No results found. ? ?ASSESSMENT: Stage IA ER/PR positive, HER-2 negative invasive carcinoma of the upper-outer quadrant of the left breast. ? ?PLAN:   ? ?1. Stage IA ER/PR positive, HER-2 negative invasive carcinoma of the upper-outer quadrant of the left breast: Patient underwent lumpectomy on June 21, 2013.  She did not require chemotherapy, but did receive adjuvant XRT.  Patient completed 5 years of letrozole in October 2020.  Her most recent mammogram on February 15, 2021 was reported as BI-RADS 1.  Repeat in July 2023.     ?2.  Osteopenia: Patient's most recent bone mineral density on August 15, 2020 reported T score of -2.6 which is slightly worse than 1 year prior where her T score was reported -2.2.  Proceed with Prolia today.  Continue calcium and vitamin D supplementation.  Repeat bone mineral density in the next 1 to 2 weeks.  Return to clinic in 6 months with repeat laboratory work and continuation of treatment.   ? ?I spent a total of 20 minutes reviewing chart data, face-to-face evaluation with the patient, counseling and coordination of care as detailed  above. ? ? ?Patient expressed understanding and was in agreement with this plan. She also understands that She can call clinic at any time with any questions, concerns, or complaints.  ? ? Cancer Staging  ?Breast cancer of upper-outer quadrant of left female breast ([place]) ?Staging form: Breast, AJCC 7th Edition ?- Clinical stage from 06/21/2013: Stage IA (T1b, N0, M0) - Signed by Lloyd Huger, MD on 09/17/2019 ?Laterality: Left ?Tumor size (mm): 7 ?Method of lymph node assessment: Sentinel lymph node biopsy ?Histologic grade (G): G2 ?Histologic grading system: 3 grade system ?Lymph-vascular invasion (LVI): LVI not present (absent)/not identified ?Residual tumor (R): R0 - None ?Paget's disease: Negative ?Tumor grade (Scarff-Bloom-Richardson system): G2 ?Estrogen receptor status: Positive ?Estrogen receptor test method: IHC ?Intensity of estrogen receptor staining: Strong ?Percentage of positive estrogen receptors (%): 90 ?Progesterone receptor status: Positive ?Progesterone receptor test method: IHC ?Intensity of progesterone receptor staining:  Weak ?Percentage of positive progesterone receptors (%): 1 ?HER2 status: Negative ?IHC of regional lymph nodes: Negative ?Results for molecular studies of regional lymph nodes: Negative ?Circulating tumor cells (CTC): Not assessed ?Disseminated tumor cells: Not assessed ?Multi-gene signature risk of recurrence: Intermediate risk ?Multi-gene signature score: 21 ? ? ?Lloyd Huger, MD   09/26/2021 2:42 PM ? ? ? ? ?

## 2021-09-26 ENCOUNTER — Inpatient Hospital Stay: Payer: PPO | Attending: Oncology

## 2021-09-26 ENCOUNTER — Encounter: Payer: Self-pay | Admitting: Oncology

## 2021-09-26 ENCOUNTER — Inpatient Hospital Stay (HOSPITAL_BASED_OUTPATIENT_CLINIC_OR_DEPARTMENT_OTHER): Payer: PPO | Admitting: Oncology

## 2021-09-26 ENCOUNTER — Other Ambulatory Visit: Payer: Self-pay

## 2021-09-26 ENCOUNTER — Inpatient Hospital Stay: Payer: PPO

## 2021-09-26 VITALS — BP 125/99 | HR 98 | Temp 99.6°F | Wt 117.1 lb

## 2021-09-26 DIAGNOSIS — M810 Age-related osteoporosis without current pathological fracture: Secondary | ICD-10-CM

## 2021-09-26 DIAGNOSIS — C50412 Malignant neoplasm of upper-outer quadrant of left female breast: Secondary | ICD-10-CM | POA: Diagnosis not present

## 2021-09-26 DIAGNOSIS — Z853 Personal history of malignant neoplasm of breast: Secondary | ICD-10-CM | POA: Insufficient documentation

## 2021-09-26 DIAGNOSIS — M8580 Other specified disorders of bone density and structure, unspecified site: Secondary | ICD-10-CM | POA: Insufficient documentation

## 2021-09-26 DIAGNOSIS — Z170 Estrogen receptor positive status [ER+]: Secondary | ICD-10-CM

## 2021-09-26 LAB — CBC WITH DIFFERENTIAL/PLATELET
Abs Immature Granulocytes: 0.01 10*3/uL (ref 0.00–0.07)
Basophils Absolute: 0.1 10*3/uL (ref 0.0–0.1)
Basophils Relative: 1 %
Eosinophils Absolute: 0.1 10*3/uL (ref 0.0–0.5)
Eosinophils Relative: 1 %
HCT: 41.4 % (ref 36.0–46.0)
Hemoglobin: 14 g/dL (ref 12.0–15.0)
Immature Granulocytes: 0 %
Lymphocytes Relative: 21 %
Lymphs Abs: 1.4 10*3/uL (ref 0.7–4.0)
MCH: 30.9 pg (ref 26.0–34.0)
MCHC: 33.8 g/dL (ref 30.0–36.0)
MCV: 91.4 fL (ref 80.0–100.0)
Monocytes Absolute: 0.7 10*3/uL (ref 0.1–1.0)
Monocytes Relative: 10 %
Neutro Abs: 4.5 10*3/uL (ref 1.7–7.7)
Neutrophils Relative %: 67 %
Platelets: 278 10*3/uL (ref 150–400)
RBC: 4.53 MIL/uL (ref 3.87–5.11)
RDW: 13.3 % (ref 11.5–15.5)
WBC: 6.8 10*3/uL (ref 4.0–10.5)
nRBC: 0 % (ref 0.0–0.2)

## 2021-09-26 LAB — COMPREHENSIVE METABOLIC PANEL
ALT: 24 U/L (ref 0–44)
AST: 36 U/L (ref 15–41)
Albumin: 4.5 g/dL (ref 3.5–5.0)
Alkaline Phosphatase: 83 U/L (ref 38–126)
Anion gap: 9 (ref 5–15)
BUN: 10 mg/dL (ref 8–23)
CO2: 27 mmol/L (ref 22–32)
Calcium: 9.4 mg/dL (ref 8.9–10.3)
Chloride: 96 mmol/L — ABNORMAL LOW (ref 98–111)
Creatinine, Ser: 0.66 mg/dL (ref 0.44–1.00)
GFR, Estimated: >60 mL/min (ref >60)
Glucose, Bld: 105 mg/dL — ABNORMAL HIGH (ref 70–99)
Potassium: 4.4 mmol/L (ref 3.5–5.1)
Sodium: 132 mmol/L — ABNORMAL LOW (ref 135–145)
Total Bilirubin: 0.7 mg/dL (ref 0.3–1.2)
Total Protein: 7.2 g/dL (ref 6.5–8.1)

## 2021-09-26 MED ORDER — DENOSUMAB 60 MG/ML ~~LOC~~ SOSY
60.0000 mg | PREFILLED_SYRINGE | Freq: Once | SUBCUTANEOUS | Status: AC
  Administered 2021-09-26: 12:00:00 60 mg via SUBCUTANEOUS
  Filled 2021-09-26: qty 1

## 2021-09-26 NOTE — Addendum Note (Signed)
Addended by: Wilford Corner on: 09/26/2021 02:48 PM ? ? Modules accepted: Orders ? ?

## 2021-09-27 LAB — CANCER ANTIGEN 27.29: CA 27.29: 33 U/mL (ref 0.0–38.6)

## 2021-10-24 ENCOUNTER — Ambulatory Visit
Admission: RE | Admit: 2021-10-24 | Discharge: 2021-10-24 | Disposition: A | Discharge status: Home / Self Care | Payer: PPO | Source: Ambulatory Visit | Attending: Oncology | Admitting: Oncology

## 2021-10-24 DIAGNOSIS — M810 Age-related osteoporosis without current pathological fracture: Secondary | ICD-10-CM | POA: Diagnosis not present

## 2021-10-24 DIAGNOSIS — M85852 Other specified disorders of bone density and structure, left thigh: Secondary | ICD-10-CM | POA: Diagnosis not present

## 2021-10-24 DIAGNOSIS — Z780 Asymptomatic menopausal state: Secondary | ICD-10-CM | POA: Diagnosis not present

## 2022-01-30 DIAGNOSIS — I10 Essential (primary) hypertension: Secondary | ICD-10-CM | POA: Diagnosis not present

## 2022-01-30 DIAGNOSIS — E7800 Pure hypercholesterolemia, unspecified: Secondary | ICD-10-CM | POA: Diagnosis not present

## 2022-02-06 DIAGNOSIS — E7800 Pure hypercholesterolemia, unspecified: Secondary | ICD-10-CM | POA: Diagnosis not present

## 2022-02-06 DIAGNOSIS — Z0000 Encounter for general adult medical examination without abnormal findings: Secondary | ICD-10-CM | POA: Diagnosis not present

## 2022-02-06 DIAGNOSIS — G620 Drug-induced polyneuropathy: Secondary | ICD-10-CM | POA: Diagnosis not present

## 2022-02-06 DIAGNOSIS — T451X5A Adverse effect of antineoplastic and immunosuppressive drugs, initial encounter: Secondary | ICD-10-CM | POA: Diagnosis not present

## 2022-02-06 DIAGNOSIS — I10 Essential (primary) hypertension: Secondary | ICD-10-CM | POA: Diagnosis not present

## 2022-02-06 DIAGNOSIS — Z1211 Encounter for screening for malignant neoplasm of colon: Secondary | ICD-10-CM | POA: Diagnosis not present

## 2022-02-18 ENCOUNTER — Ambulatory Visit
Admission: RE | Admit: 2022-02-18 | Discharge: 2022-02-18 | Disposition: A | Discharge status: Home / Self Care | Payer: PPO | Source: Ambulatory Visit | Attending: Oncology | Admitting: Oncology

## 2022-02-18 DIAGNOSIS — Z853 Personal history of malignant neoplasm of breast: Secondary | ICD-10-CM | POA: Insufficient documentation

## 2022-02-18 DIAGNOSIS — Z1231 Encounter for screening mammogram for malignant neoplasm of breast: Secondary | ICD-10-CM | POA: Insufficient documentation

## 2022-02-18 DIAGNOSIS — C50412 Malignant neoplasm of upper-outer quadrant of left female breast: Secondary | ICD-10-CM

## 2022-03-31 ENCOUNTER — Inpatient Hospital Stay (HOSPITAL_BASED_OUTPATIENT_CLINIC_OR_DEPARTMENT_OTHER): Payer: PPO | Admitting: Nurse Practitioner

## 2022-03-31 ENCOUNTER — Inpatient Hospital Stay: Payer: PPO

## 2022-03-31 ENCOUNTER — Inpatient Hospital Stay: Payer: PPO | Attending: Nurse Practitioner

## 2022-03-31 VITALS — BP 134/74 | HR 71 | Temp 97.4°F | Resp 16 | Wt 116.0 lb

## 2022-03-31 DIAGNOSIS — Z79899 Other long term (current) drug therapy: Secondary | ICD-10-CM

## 2022-03-31 DIAGNOSIS — G629 Polyneuropathy, unspecified: Secondary | ICD-10-CM | POA: Insufficient documentation

## 2022-03-31 DIAGNOSIS — Z5181 Encounter for therapeutic drug level monitoring: Secondary | ICD-10-CM

## 2022-03-31 DIAGNOSIS — M8588 Other specified disorders of bone density and structure, other site: Secondary | ICD-10-CM | POA: Diagnosis not present

## 2022-03-31 DIAGNOSIS — Z803 Family history of malignant neoplasm of breast: Secondary | ICD-10-CM | POA: Insufficient documentation

## 2022-03-31 DIAGNOSIS — M810 Age-related osteoporosis without current pathological fracture: Secondary | ICD-10-CM

## 2022-03-31 DIAGNOSIS — I10 Essential (primary) hypertension: Secondary | ICD-10-CM | POA: Insufficient documentation

## 2022-03-31 DIAGNOSIS — Z08 Encounter for follow-up examination after completed treatment for malignant neoplasm: Secondary | ICD-10-CM

## 2022-03-31 DIAGNOSIS — Z79811 Long term (current) use of aromatase inhibitors: Secondary | ICD-10-CM | POA: Insufficient documentation

## 2022-03-31 DIAGNOSIS — Z923 Personal history of irradiation: Secondary | ICD-10-CM | POA: Insufficient documentation

## 2022-03-31 DIAGNOSIS — Z170 Estrogen receptor positive status [ER+]: Secondary | ICD-10-CM

## 2022-03-31 DIAGNOSIS — Z853 Personal history of malignant neoplasm of breast: Secondary | ICD-10-CM | POA: Insufficient documentation

## 2022-03-31 DIAGNOSIS — Z801 Family history of malignant neoplasm of trachea, bronchus and lung: Secondary | ICD-10-CM | POA: Diagnosis not present

## 2022-03-31 LAB — CBC WITH DIFFERENTIAL/PLATELET
Abs Immature Granulocytes: 0.01 10*3/uL (ref 0.00–0.07)
Basophils Absolute: 0.1 10*3/uL (ref 0.0–0.1)
Basophils Relative: 1 %
Eosinophils Absolute: 0.1 10*3/uL (ref 0.0–0.5)
Eosinophils Relative: 1 %
HCT: 40.2 % (ref 36.0–46.0)
Hemoglobin: 13.6 g/dL (ref 12.0–15.0)
Immature Granulocytes: 0 %
Lymphocytes Relative: 19 %
Lymphs Abs: 1.3 10*3/uL (ref 0.7–4.0)
MCH: 31.3 pg (ref 26.0–34.0)
MCHC: 33.8 g/dL (ref 30.0–36.0)
MCV: 92.4 fL (ref 80.0–100.0)
Monocytes Absolute: 0.7 10*3/uL (ref 0.1–1.0)
Monocytes Relative: 10 %
Neutro Abs: 4.7 10*3/uL (ref 1.7–7.7)
Neutrophils Relative %: 69 %
Platelets: 283 10*3/uL (ref 150–400)
RBC: 4.35 MIL/uL (ref 3.87–5.11)
RDW: 12.9 % (ref 11.5–15.5)
WBC: 6.8 10*3/uL (ref 4.0–10.5)
nRBC: 0 % (ref 0.0–0.2)

## 2022-03-31 LAB — COMPREHENSIVE METABOLIC PANEL
ALT: 16 U/L (ref 0–44)
AST: 27 U/L (ref 15–41)
Albumin: 4.2 g/dL (ref 3.5–5.0)
Alkaline Phosphatase: 68 U/L (ref 38–126)
Anion gap: 6 (ref 5–15)
BUN: 10 mg/dL (ref 8–23)
CO2: 30 mmol/L (ref 22–32)
Calcium: 9.3 mg/dL (ref 8.9–10.3)
Chloride: 97 mmol/L — ABNORMAL LOW (ref 98–111)
Creatinine, Ser: 0.63 mg/dL (ref 0.44–1.00)
GFR, Estimated: >60 mL/min (ref >60)
Glucose, Bld: 89 mg/dL (ref 70–99)
Potassium: 4.3 mmol/L (ref 3.5–5.1)
Sodium: 133 mmol/L — ABNORMAL LOW (ref 135–145)
Total Bilirubin: 0.5 mg/dL (ref 0.3–1.2)
Total Protein: 7 g/dL (ref 6.5–8.1)

## 2022-03-31 MED ORDER — DENOSUMAB 60 MG/ML ~~LOC~~ SOSY
60.0000 mg | PREFILLED_SYRINGE | Freq: Once | SUBCUTANEOUS | Status: AC
  Administered 2022-03-31: 60 mg via SUBCUTANEOUS
  Filled 2022-03-31: qty 1

## 2022-03-31 NOTE — Progress Notes (Signed)
[place]  [phone] Fax:(336) [phone]  ID: Raven Barber OB: 20-Mar-1951  MR#: [id number]  CXK#:[id number]  Patient Care Team: Kirk Ruths, MD as PCP - General (Internal Medicine)  CHIEF COMPLAINT: Stage IA ER/PR positive, HER-2 negative invasive carcinoma of the upper-outer quadrant of the left breast.  INTERVAL HISTORY: Patient is 71 year old female returns to clinic for routine 22-monthevaluation and consideration of Prolia.  She completed letrozole in 2020.  Continues to feel well and remains asymptomatic Recurrence of breast cancer.  Performs self breast exam and denies new lumps or bumps.  Has chronic peripheral neuropathy of the balls of her feet and her fingertips which is unchanged. No falls or fractures.  Continues to see a dentist twice a year and denies recent cavities or infections.  She has been exercising daily.  She denies chest pain, shortness of breath, cough, hemoptysis.  No nausea, vomiting, constipation, diarrhea.  No urinary complaints.  She denies other specific complaints today.  REVIEW OF SYSTEMS:   Review of Systems  Constitutional:  Negative for chills, fever, malaise/fatigue and weight loss.  HENT:  Negative for hearing loss, nosebleeds, sore throat and tinnitus.   Eyes:  Negative for blurred vision and double vision.  Respiratory:  Negative for cough, hemoptysis, shortness of breath and wheezing.   Cardiovascular:  Negative for chest pain, palpitations and leg swelling.  Gastrointestinal:  Negative for abdominal pain, blood in stool, constipation, diarrhea, melena, nausea and vomiting.  Genitourinary:  Negative for dysuria and urgency.  Musculoskeletal:  Negative for back pain, falls, joint pain and myalgias.  Skin:  Negative for itching and rash.  Neurological:  Positive for sensory change (balls of feet and fingertips). Negative for dizziness, tingling, loss of consciousness, weakness and headaches.   Endo/Heme/Allergies:  Negative for environmental allergies. Does not bruise/bleed easily.  Psychiatric/Behavioral:  Negative for depression. The patient is not nervous/anxious and does not have insomnia.   As per HPI. Otherwise, a complete review of systems is negative.  PAST MEDICAL HISTORY: Past Medical History:  Diagnosis Date   [place]   [place] breast/radiation and chemo   [place]    History of bone density study 06/2014   History of colonoscopy 2012   History of mammogram 06/2014   History of Papanicolaou smear of cervix 2013   Hypercholesteremia    Hypertension    Personal history of chemotherapy    Personal history of radiation therapy     PAST SURGICAL HISTORY: Past Surgical History:  Procedure Laterality Date   BREAST BIOPSY Left 06/01/2013   invasive mammary carcinoma   BREAST LUMPECTOMY Left 06/21/2013   grade II invasive mammary carcinoma, clear margins, LN negative   PORT-A-CATH REMOVAL  11/15/2014   PORTACATH PLACEMENT     TUBAL LIGATION      FAMILY HISTORY: Family History  Problem Relation Age of Onset   Breast cancer Cousin 445  Cancer Paternal Uncle        lung   Breast cancer Paternal Aunt 426   ADVANCED DIRECTIVES (Y/N):  N  HEALTH MAINTENANCE: Social History   Tobacco Use   Smoking status: Never   Smokeless tobacco: Never  Substance Use Topics   Alcohol use: No    Alcohol/week: 0.0 standard drinks of alcohol   Drug use: No     Colonoscopy:  PAP:  Bone density:  Lipid panel:  Allergies  Allergen Reactions   Ace Inhibitors Cough and Other (See  Comments)   Lisinopril     Other reaction(s): Cough    Current Outpatient Medications  Medication Sig Dispense Refill   amLODipine (NORVASC) 5 MG tablet Take 5 mg by mouth daily.     Calcium 600-200 MG-UNIT per tablet Take 2 tablets by mouth daily.     gabapentin (NEURONTIN) 100 MG capsule Take 100 mg by mouth 3 (three) times daily.     Omega-3  Fatty Acids (FISH OIL) 1000 MG CAPS Take 1 capsule by mouth daily.     pravastatin (PRAVACHOL) 40 MG tablet Take 40 mg by mouth daily.  0   No current facility-administered medications for this visit.   Facility-Administered Medications Ordered in Other Visits  Medication Dose Route Frequency Provider Last Rate Last Admin   denosumab (PROLIA) injection 60 mg  60 mg Subcutaneous Once Lloyd Huger, MD        OBJECTIVE: Vitals:   03/31/22 1046  BP: 134/74  Pulse: 71  Resp: 16  Temp: (!) 97.4 F (36.3 C)  SpO2: 100%     Body mass index is 22.65 kg/m.    ECOG FS:0 - Asymptomatic  General: thin build. Unaccompanied.  Eyes: Pink conjunctiva, anicteric sclera. Lungs: Clear to auscultation bilaterally.  No audible wheezing or coughing Heart: Regular rate and rhythm.  Abdomen: Soft, nontender, nondistended.  Musculoskeletal: No edema, cyanosis, or clubbing. Neuro: Alert, answering all questions appropriately. Cranial nerves grossly intact. Skin: No rashes or petechiae noted. Psych: Normal affect. Breast: declines: recent exam for mammogram  LAB RESULTS:  Lab Results  Component Value Date   NA 133 (L) 03/31/2022   K 4.3 03/31/2022   CL 97 (L) 03/31/2022   CO2 30 03/31/2022   GLUCOSE 89 03/31/2022   BUN 10 03/31/2022   CREATININE 0.63 03/31/2022   CALCIUM 9.3 03/31/2022   PROT 7.0 03/31/2022   ALBUMIN 4.2 03/31/2022   AST 27 03/31/2022   ALT 16 03/31/2022   ALKPHOS 68 03/31/2022   BILITOT 0.5 03/31/2022   GFRNONAA >60 03/31/2022   GFRAA >60 09/19/2019    Lab Results  Component Value Date   WBC 6.8 03/31/2022   NEUTROABS 4.7 03/31/2022   HGB 13.6 03/31/2022   HCT 40.2 03/31/2022   MCV 92.4 03/31/2022   PLT 283 03/31/2022    STUDIES: No results found.  02/18/22- MM 3D Screen Breast Bilateral ACR Breast Density Category b: There are scattered areas of fibroglandular density. FINDINGS: There are no findings suspicious for malignancy. IMPRESSION: No  mammographic evidence of malignancy. A result letter of this screening mammogram will be mailed directly to the patient. RECOMMENDATION: Screening mammogram in one year. (Code:SM-B-01Y) BI-RADS CATEGORY  1: Negative  10/24/21- Bone Density ASSESSMENT: The probability of a major osteoporotic fracture is 12.4% within the next ten years. The probability of a hip fracture is 3.1% within the next ten years. ASSESSMENT: The BMD measured at AP Spine L1-L2 is 0.896 g/cm2 with a T-score of -2.3. This patient is considered osteopenic according to [facility]) criteria. The scan quality is good. L-3 & 4 was excluded due to degenerative changes. Compared with prior study, there has been no significant change in the spine. Compared with prior study, there has been significant increase in the total hip.   ASSESSMENT: Stage IA ER/PR positive, HER-2 negative invasive carcinoma of the upper-outer quadrant of the left breast.  PLAN:    1. Stage IA ER/PR positive, HER-2 negative invasive carcinoma of the upper-outer quadrant of the left breast: Patient  underwent lumpectomy on June 21, 2013.  She did not require chemotherapy, but did receive adjuvant XRT.  Patient completed 5 years of letrozole in October 2020.  Her most recent mammogram on February 19, 2022 was reported as BI-RADS 1.  Repeat in August 2024.       2.  Osteopenia: Patient most recent bone density test showed improvement in her t score to -2.3. Persistent osteopenia however, improved from prior. Labs reviewed and acceptable for prolia today. She denies dental pain or infections. Continues to see a dentist twice a year. She will continue calcium and vitamin d along with weight bearing exercise as tolerated. Applauded her on her efforts to exercise currently. Repeat bone density in 2025. She will rtc in 6 months for consideration of prolia.  Bone density test- T scores: 08/15/20-  -2.6 08/15/19-  -2.2  08/12/2016 -   -2.5 07/05/2014 -2.9  Disposition: Prolia today 6 mo- lab (cbc, cmp, ca27.29), see me, +/- prolia  I spent a total of 20 minutes reviewing chart data, face-to-face evaluation with the patient, counseling and coordination of care as detailed above.  Patient expressed understanding and was in agreement with this plan. She also understands that She can call clinic at any time with any questions, concerns, or complaints.    Cancer Staging  Breast cancer of upper-outer quadrant of left female breast [facility]) Staging form: Breast, AJCC 7th Edition - Clinical stage from 06/21/2013: Stage IA (T1b, N0, M0) - Signed by Lloyd Huger, MD on 09/17/2019 Laterality: Left Tumor size (mm): 7 Method of lymph node assessment: Sentinel lymph node biopsy Histologic grade (G): G2 Histologic grading system: 3 grade system Lymph-vascular invasion (LVI): LVI not present (absent)/not identified Residual tumor (R): R0 - None Paget's disease: Negative Tumor grade (Scarff-Bloom-Richardson system): G2 Estrogen receptor status: Positive Estrogen receptor test method: IHC Intensity of estrogen receptor staining: Strong Percentage of positive estrogen receptors (%): 90 Progesterone receptor status: Positive Progesterone receptor test method: IHC Intensity of progesterone receptor staining: Weak Percentage of positive progesterone receptors (%): 1 HER2 status: Negative IHC of regional lymph nodes: Negative Results for molecular studies of regional lymph nodes: Negative Circulating tumor cells (CTC): Not assessed Disseminated tumor cells: Not assessed Multi-gene signature risk of recurrence: Intermediate risk Multi-gene signature score: 21  Verlon Au, NP   03/31/2022

## 2022-04-01 LAB — CANCER ANTIGEN 27.29: CA 27.29: 28.8 U/mL (ref 0.0–38.6)

## 2022-05-19 DIAGNOSIS — H2513 Age-related nuclear cataract, bilateral: Secondary | ICD-10-CM | POA: Diagnosis not present

## 2022-05-21 DIAGNOSIS — Z1211 Encounter for screening for malignant neoplasm of colon: Secondary | ICD-10-CM | POA: Diagnosis not present

## 2022-05-21 DIAGNOSIS — Z86010 Personal history of colonic polyps: Secondary | ICD-10-CM | POA: Diagnosis not present

## 2022-08-04 DIAGNOSIS — E7800 Pure hypercholesterolemia, unspecified: Secondary | ICD-10-CM | POA: Diagnosis not present

## 2022-08-04 DIAGNOSIS — I10 Essential (primary) hypertension: Secondary | ICD-10-CM | POA: Diagnosis not present

## 2022-08-11 DIAGNOSIS — G620 Drug-induced polyneuropathy: Secondary | ICD-10-CM | POA: Diagnosis not present

## 2022-08-11 DIAGNOSIS — E7800 Pure hypercholesterolemia, unspecified: Secondary | ICD-10-CM | POA: Diagnosis not present

## 2022-08-11 DIAGNOSIS — I10 Essential (primary) hypertension: Secondary | ICD-10-CM | POA: Diagnosis not present

## 2022-08-11 DIAGNOSIS — T451X5A Adverse effect of antineoplastic and immunosuppressive drugs, initial encounter: Secondary | ICD-10-CM | POA: Diagnosis not present

## 2022-09-29 ENCOUNTER — Inpatient Hospital Stay: Payer: PPO | Attending: Nurse Practitioner

## 2022-09-29 ENCOUNTER — Other Ambulatory Visit: Payer: Self-pay

## 2022-09-29 ENCOUNTER — Inpatient Hospital Stay: Payer: PPO

## 2022-09-29 ENCOUNTER — Encounter: Payer: Self-pay | Admitting: Nurse Practitioner

## 2022-09-29 ENCOUNTER — Inpatient Hospital Stay (HOSPITAL_BASED_OUTPATIENT_CLINIC_OR_DEPARTMENT_OTHER): Payer: PPO | Admitting: Nurse Practitioner

## 2022-09-29 VITALS — BP 124/61 | HR 74 | Temp 97.9°F | Resp 18 | Ht 60.0 in | Wt 117.0 lb

## 2022-09-29 DIAGNOSIS — Z803 Family history of malignant neoplasm of breast: Secondary | ICD-10-CM | POA: Insufficient documentation

## 2022-09-29 DIAGNOSIS — M8580 Other specified disorders of bone density and structure, unspecified site: Secondary | ICD-10-CM

## 2022-09-29 DIAGNOSIS — Z170 Estrogen receptor positive status [ER+]: Secondary | ICD-10-CM

## 2022-09-29 DIAGNOSIS — I10 Essential (primary) hypertension: Secondary | ICD-10-CM | POA: Diagnosis not present

## 2022-09-29 DIAGNOSIS — Z853 Personal history of malignant neoplasm of breast: Secondary | ICD-10-CM

## 2022-09-29 DIAGNOSIS — Z5181 Encounter for therapeutic drug level monitoring: Secondary | ICD-10-CM

## 2022-09-29 DIAGNOSIS — M810 Age-related osteoporosis without current pathological fracture: Secondary | ICD-10-CM

## 2022-09-29 DIAGNOSIS — M8588 Other specified disorders of bone density and structure, other site: Secondary | ICD-10-CM | POA: Insufficient documentation

## 2022-09-29 DIAGNOSIS — Z08 Encounter for follow-up examination after completed treatment for malignant neoplasm: Secondary | ICD-10-CM

## 2022-09-29 DIAGNOSIS — Z801 Family history of malignant neoplasm of trachea, bronchus and lung: Secondary | ICD-10-CM

## 2022-09-29 LAB — COMPREHENSIVE METABOLIC PANEL
ALT: 19 U/L (ref 0–44)
AST: 30 U/L (ref 15–41)
Albumin: 4.2 g/dL (ref 3.5–5.0)
Alkaline Phosphatase: 80 U/L (ref 38–126)
Anion gap: 8 (ref 5–15)
BUN: 9 mg/dL (ref 8–23)
CO2: 28 mmol/L (ref 22–32)
Calcium: 9.1 mg/dL (ref 8.9–10.3)
Chloride: 96 mmol/L — ABNORMAL LOW (ref 98–111)
Creatinine, Ser: 0.74 mg/dL (ref 0.44–1.00)
GFR, Estimated: >60 mL/min (ref >60)
Glucose, Bld: 92 mg/dL (ref 70–99)
Potassium: 4.1 mmol/L (ref 3.5–5.1)
Sodium: 132 mmol/L — ABNORMAL LOW (ref 135–145)
Total Bilirubin: 0.6 mg/dL (ref 0.3–1.2)
Total Protein: 7 g/dL (ref 6.5–8.1)

## 2022-09-29 LAB — CBC WITH DIFFERENTIAL/PLATELET
Abs Immature Granulocytes: 0.03 10*3/uL (ref 0.00–0.07)
Basophils Absolute: 0.1 10*3/uL (ref 0.0–0.1)
Basophils Relative: 1 %
Eosinophils Absolute: 0.1 10*3/uL (ref 0.0–0.5)
Eosinophils Relative: 2 %
HCT: 40.5 % (ref 36.0–46.0)
Hemoglobin: 13.6 g/dL (ref 12.0–15.0)
Immature Granulocytes: 1 %
Lymphocytes Relative: 23 %
Lymphs Abs: 1.3 10*3/uL (ref 0.7–4.0)
MCH: 31 pg (ref 26.0–34.0)
MCHC: 33.6 g/dL (ref 30.0–36.0)
MCV: 92.3 fL (ref 80.0–100.0)
Monocytes Absolute: 0.5 10*3/uL (ref 0.1–1.0)
Monocytes Relative: 9 %
Neutro Abs: 3.7 10*3/uL (ref 1.7–7.7)
Neutrophils Relative %: 64 %
Platelets: 315 10*3/uL (ref 150–400)
RBC: 4.39 MIL/uL (ref 3.87–5.11)
RDW: 13.3 % (ref 11.5–15.5)
WBC: 5.7 10*3/uL (ref 4.0–10.5)
nRBC: 0 % (ref 0.0–0.2)

## 2022-09-29 MED ORDER — DENOSUMAB 60 MG/ML ~~LOC~~ SOSY
60.0000 mg | PREFILLED_SYRINGE | Freq: Once | SUBCUTANEOUS | Status: AC
  Administered 2022-09-29: 60 mg via SUBCUTANEOUS
  Filled 2022-09-29: qty 1

## 2022-09-29 NOTE — Progress Notes (Signed)
[name]  Telephone:(336) [phone] Fax:(336) [phone]  ID: Raven Barber OB: 29-Apr-1951  MR#: [id number]  [id number]  Patient Care Team: Kirk Ruths, MD as PCP - General (Internal Medicine) Verlon Au, NP as Nurse Practitioner (Nurse Practitioner)  CHIEF COMPLAINT: Stage IA ER/PR positive, HER-2 negative invasive carcinoma of the upper-outer quadrant of the left breast.  INTERVAL HISTORY: Patient is 72 year old female returns to clinic for routine 45-monthevaluation and consideration of Prolia.  She completed letrozole in 2020. She continues to feel well. Performs self breast exams and denies new lumps or bumps. She continues to exercise. Sees dentist twice a year and denies cavities or infections. She has been doing treadmill, bike, and walking several miles a day. No pain, falls, or fractures.   REVIEW OF SYSTEMS:   Review of Systems  Constitutional:  Negative for chills, fever, malaise/fatigue and weight loss.  HENT:  Negative for hearing loss, nosebleeds, sore throat and tinnitus.   Eyes:  Negative for blurred vision and double vision.  Respiratory:  Negative for cough, hemoptysis, shortness of breath and wheezing.   Cardiovascular:  Negative for chest pain, palpitations and leg swelling.  Gastrointestinal:  Negative for abdominal pain, blood in stool, constipation, diarrhea, melena, nausea and vomiting.  Genitourinary:  Negative for dysuria and urgency.  Musculoskeletal:  Negative for back pain, falls, joint pain and myalgias.  Skin:  Negative for itching and rash.  Neurological:  Positive for sensory change (balls of feet and fingertips). Negative for dizziness, tingling, loss of consciousness, weakness and headaches.  Endo/Heme/Allergies:  Negative for environmental allergies. Does not bruise/bleed easily.  Psychiatric/Behavioral:  Negative for depression. The patient is not nervous/anxious and does not have insomnia.   As per HPI. Otherwise,  a complete review of systems is negative.  PAST MEDICAL HISTORY: Past Medical History:  Diagnosis Date   [name]   left breast/radiation and chemo   [place]    History of bone density study 06/2014   History of colonoscopy 2012   History of mammogram 06/2014   History of Papanicolaou smear of cervix 2013   Hypercholesteremia    Hypertension    Personal history of chemotherapy    Personal history of radiation therapy     PAST SURGICAL HISTORY: Past Surgical History:  Procedure Laterality Date   BREAST BIOPSY Left 06/01/2013   invasive mammary carcinoma   BREAST LUMPECTOMY Left 06/21/2013   grade II invasive mammary carcinoma, clear margins, LN negative   PORT-A-CATH REMOVAL  11/15/2014   PORTACATH PLACEMENT     TUBAL LIGATION      FAMILY HISTORY: Family History  Problem Relation Age of Onset   Breast cancer Cousin 411  Cancer Paternal Uncle        lung   Breast cancer Paternal Aunt 420   ADVANCED DIRECTIVES (Y/N):  N  HEALTH MAINTENANCE: Social History   Tobacco Use   Smoking status: Never   Smokeless tobacco: Never  Substance Use Topics   Alcohol use: No    Alcohol/week: 0.0 standard drinks of alcohol   Drug use: No     Colonoscopy:  PAP:  Bone density:  Lipid panel:  Allergies  Allergen Reactions   Ace Inhibitors Cough and Other (See Comments)   Lisinopril     Other reaction(s): Cough    Current Outpatient Medications  Medication Sig Dispense Refill   amLODipine (NORVASC) 5 MG tablet Take 5 mg by mouth daily.  Calcium 600-200 MG-UNIT per tablet Take 2 tablets by mouth daily.     gabapentin (NEURONTIN) 100 MG capsule Take 100 mg by mouth 3 (three) times daily.     Omega-3 Fatty Acids (FISH OIL) 1000 MG CAPS Take 1 capsule by mouth daily.     pravastatin (PRAVACHOL) 40 MG tablet Take 40 mg by mouth daily.  0   No current facility-administered medications for this visit.    OBJECTIVE: Vitals:    09/29/22 0922  BP: 124/61  Pulse: 74  Resp: 18  Temp: 97.9 F (36.6 C)     Body mass index is 22.85 kg/m.    ECOG FS:0 - Asymptomatic  General: Thin build. Unaccompanied.  Eyes: Pink conjunctiva, anicteric sclera. Lungs: Clear to auscultation bilaterally.  No audible wheezing or coughing Heart: Regular rate and rhythm.  Abdomen: Soft, nontender, nondistended.  Musculoskeletal: No edema, cyanosis, or clubbing. Neuro: Alert, answering all questions appropriately. Cranial nerves grossly intact. Skin: No rashes or petechiae noted. Psych: Normal affect. Breast: Breast exam was performed in seated and lying down position. Patient is status post left lumpectomy with a well-healed surgical scar. No evidence of any palpable masses. No evidence of axillary adenopathy. No evidence of any palpable masses or lumps in the right breast. No evidence of right axillary adenopathy   LAB RESULTS:  Lab Results  Component Value Date   NA 132 (L) 09/29/2022   K 4.1 09/29/2022   CL 96 (L) 09/29/2022   CO2 28 09/29/2022   GLUCOSE 92 09/29/2022   BUN 9 09/29/2022   CREATININE 0.74 09/29/2022   CALCIUM 9.1 09/29/2022   PROT 7.0 09/29/2022   ALBUMIN 4.2 09/29/2022   AST 30 09/29/2022   ALT 19 09/29/2022   ALKPHOS 80 09/29/2022   BILITOT 0.6 09/29/2022   GFRNONAA >60 09/29/2022   GFRAA >60 09/19/2019    Lab Results  Component Value Date   WBC 5.7 09/29/2022   NEUTROABS 3.7 09/29/2022   HGB 13.6 09/29/2022   HCT 40.5 09/29/2022   MCV 92.3 09/29/2022   PLT 315 09/29/2022    STUDIES: No results found.  02/18/22- MM 3D Screen Breast Bilateral ACR Breast Density Category b: There are scattered areas of fibroglandular density. FINDINGS: There are no findings suspicious for malignancy. IMPRESSION: No mammographic evidence of malignancy. A result letter of this screening mammogram will be mailed directly to the patient. RECOMMENDATION: Screening mammogram in one year.  (Code:SM-B-01Y) BI-RADS CATEGORY  1: Negative  10/24/21- Bone Density ASSESSMENT: The probability of a major osteoporotic fracture is 12.4% within the next ten years. The probability of a hip fracture is 3.1% within the next ten years. ASSESSMENT: The BMD measured at AP Spine L1-L2 is 0.896 g/cm2 with a T-score of -2.3. This patient is considered osteopenic according to [facility]) criteria. The scan quality is good. L-3 & 4 was excluded due to degenerative changes. Compared with prior study, there has been no significant change in the spine. Compared with prior study, there has been significant increase in the total hip.   ASSESSMENT: Stage IA ER/PR positive, HER-2 negative invasive carcinoma of the upper-outer quadrant of the left breast.  PLAN:    1. Stage IA ER/PR positive, HER-2 negative invasive carcinoma of the upper-outer quadrant of the left breast: Patient underwent lumpectomy on June 21, 2013.  She did not require chemotherapy, but did receive adjuvant XRT.  Patient completed 5 years of letrozole in October 2020.  Her most recent mammogram on February 19, 2022 was reported as BI-RADS 1.  Repeat in August 2024. She will continue annual breast cancer surveillance. Breast exam today was reassuring and clinically NED.   2.  Osteopenia: Patient most recent bone density test showed improvement in her t score to -2.3. Persistent osteopenia, however, improved from prior. Labs reviewed and acceptable for prolia today. She denies dental pain or infections. Continues to see a dentist twice a year. She will continue calcium and vitamin d along with weight bearing exercise as tolerated. Applauded her on her efforts to exercise currently. Repeat bone density in 2025. She will rtc in 6 months for consideration of prolia. Per uptodate can consider 5-10 years of treatment duration.  Bone density test- T scores: 10/24/21  - 2.3 08/15/20-  -2.6 08/15/19-  -2.2  08/12/2016 -   -2.5 07/05/2014 -2.9  Disposition: Prolia today August - mammogram 6 mo- lab, (cbc, cmp, ca27.29), see me, +/- prolia  I spent a total of 20 minutes reviewing chart data, face-to-face evaluation with the patient, counseling and coordination of care as detailed above.  Patient expressed understanding and was in agreement with this plan. She also understands that She can call clinic at any time with any questions, concerns, or complaints.    Cancer Staging  Breast cancer of upper-outer quadrant of left female breast [facility]) Staging form: Breast, AJCC 7th Edition - Clinical stage from 06/21/2013: Stage IA (T1b, N0, M0) - Signed by Lloyd Huger, MD on 09/17/2019 Laterality: Left Tumor size (mm): 7 Method of lymph node assessment: Sentinel lymph node biopsy Histologic grade (G): G2 Histologic grading system: 3 grade system Lymph-vascular invasion (LVI): LVI not present (absent)/not identified Residual tumor (R): R0 - None Paget's disease: Negative Tumor grade (Scarff-Bloom-Richardson system): G2 Estrogen receptor status: Positive Estrogen receptor test method: IHC Intensity of estrogen receptor staining: Strong Percentage of positive estrogen receptors (%): 90 Progesterone receptor status: Positive Progesterone receptor test method: IHC Intensity of progesterone receptor staining: Weak Percentage of positive progesterone receptors (%): 1 HER2 status: Negative IHC of regional lymph nodes: Negative Results for molecular studies of regional lymph nodes: Negative Circulating tumor cells (CTC): Not assessed Disseminated tumor cells: Not assessed Multi-gene signature risk of recurrence: Intermediate risk Multi-gene signature score: 21  Verlon Au, NP   09/29/2022

## 2022-09-30 LAB — CANCER ANTIGEN 27.29: CA 27.29: 25.4 U/mL (ref 0.0–38.6)

## 2023-02-04 DIAGNOSIS — E7800 Pure hypercholesterolemia, unspecified: Secondary | ICD-10-CM | POA: Diagnosis not present

## 2023-02-04 DIAGNOSIS — I10 Essential (primary) hypertension: Secondary | ICD-10-CM | POA: Diagnosis not present

## 2023-02-11 DIAGNOSIS — Z0000 Encounter for general adult medical examination without abnormal findings: Secondary | ICD-10-CM | POA: Diagnosis not present

## 2023-02-11 DIAGNOSIS — T451X5A Adverse effect of antineoplastic and immunosuppressive drugs, initial encounter: Secondary | ICD-10-CM | POA: Diagnosis not present

## 2023-02-11 DIAGNOSIS — E7800 Pure hypercholesterolemia, unspecified: Secondary | ICD-10-CM | POA: Diagnosis not present

## 2023-02-11 DIAGNOSIS — I10 Essential (primary) hypertension: Secondary | ICD-10-CM | POA: Diagnosis not present

## 2023-02-11 DIAGNOSIS — G620 Drug-induced polyneuropathy: Secondary | ICD-10-CM | POA: Diagnosis not present

## 2023-02-20 ENCOUNTER — Ambulatory Visit
Admission: RE | Admit: 2023-02-20 | Discharge: 2023-02-20 | Disposition: A | Discharge status: Home / Self Care | Payer: PPO | Source: Ambulatory Visit | Attending: Nurse Practitioner | Admitting: Nurse Practitioner

## 2023-02-20 DIAGNOSIS — Z1231 Encounter for screening mammogram for malignant neoplasm of breast: Secondary | ICD-10-CM | POA: Diagnosis not present

## 2023-02-20 DIAGNOSIS — Z853 Personal history of malignant neoplasm of breast: Secondary | ICD-10-CM | POA: Diagnosis not present

## 2023-02-20 DIAGNOSIS — Z08 Encounter for follow-up examination after completed treatment for malignant neoplasm: Secondary | ICD-10-CM

## 2023-04-03 ENCOUNTER — Inpatient Hospital Stay: Payer: PPO | Admitting: Medical Oncology

## 2023-04-03 ENCOUNTER — Encounter: Payer: Self-pay | Admitting: Medical Oncology

## 2023-04-03 ENCOUNTER — Inpatient Hospital Stay: Payer: PPO

## 2023-04-03 ENCOUNTER — Inpatient Hospital Stay: Payer: PPO | Attending: Oncology

## 2023-04-03 VITALS — BP 138/71 | HR 78 | Temp 97.2°F | Wt 115.0 lb

## 2023-04-03 DIAGNOSIS — Z803 Family history of malignant neoplasm of breast: Secondary | ICD-10-CM

## 2023-04-03 DIAGNOSIS — Z23 Encounter for immunization: Secondary | ICD-10-CM | POA: Insufficient documentation

## 2023-04-03 DIAGNOSIS — R011 Cardiac murmur, unspecified: Secondary | ICD-10-CM

## 2023-04-03 DIAGNOSIS — Z801 Family history of malignant neoplasm of trachea, bronchus and lung: Secondary | ICD-10-CM

## 2023-04-03 DIAGNOSIS — Z79899 Other long term (current) drug therapy: Secondary | ICD-10-CM | POA: Diagnosis not present

## 2023-04-03 DIAGNOSIS — Z853 Personal history of malignant neoplasm of breast: Secondary | ICD-10-CM

## 2023-04-03 DIAGNOSIS — Z08 Encounter for follow-up examination after completed treatment for malignant neoplasm: Secondary | ICD-10-CM

## 2023-04-03 DIAGNOSIS — Z9221 Personal history of antineoplastic chemotherapy: Secondary | ICD-10-CM | POA: Diagnosis not present

## 2023-04-03 DIAGNOSIS — M810 Age-related osteoporosis without current pathological fracture: Secondary | ICD-10-CM

## 2023-04-03 DIAGNOSIS — Z170 Estrogen receptor positive status [ER+]: Secondary | ICD-10-CM | POA: Insufficient documentation

## 2023-04-03 DIAGNOSIS — C50412 Malignant neoplasm of upper-outer quadrant of left female breast: Secondary | ICD-10-CM | POA: Insufficient documentation

## 2023-04-03 DIAGNOSIS — M8580 Other specified disorders of bone density and structure, unspecified site: Secondary | ICD-10-CM | POA: Diagnosis not present

## 2023-04-03 DIAGNOSIS — Z923 Personal history of irradiation: Secondary | ICD-10-CM | POA: Insufficient documentation

## 2023-04-03 DIAGNOSIS — M8588 Other specified disorders of bone density and structure, other site: Secondary | ICD-10-CM | POA: Diagnosis not present

## 2023-04-03 DIAGNOSIS — Z5181 Encounter for therapeutic drug level monitoring: Secondary | ICD-10-CM

## 2023-04-03 LAB — CBC WITH DIFFERENTIAL/PLATELET
Abs Immature Granulocytes: 0.05 10*3/uL (ref 0.00–0.07)
Basophils Absolute: 0.1 10*3/uL (ref 0.0–0.1)
Basophils Relative: 1 %
Eosinophils Absolute: 0.1 10*3/uL (ref 0.0–0.5)
Eosinophils Relative: 1 %
HCT: 39.6 % (ref 36.0–46.0)
Hemoglobin: 13.5 g/dL (ref 12.0–15.0)
Immature Granulocytes: 1 %
Lymphocytes Relative: 16 %
Lymphs Abs: 1.3 10*3/uL (ref 0.7–4.0)
MCH: 31.2 pg (ref 26.0–34.0)
MCHC: 34.1 g/dL (ref 30.0–36.0)
MCV: 91.5 fL (ref 80.0–100.0)
Monocytes Absolute: 0.7 10*3/uL (ref 0.1–1.0)
Monocytes Relative: 9 %
Neutro Abs: 5.9 10*3/uL (ref 1.7–7.7)
Neutrophils Relative %: 72 %
Platelets: 293 10*3/uL (ref 150–400)
RBC: 4.33 MIL/uL (ref 3.87–5.11)
RDW: 12.8 % (ref 11.5–15.5)
WBC: 8.1 10*3/uL (ref 4.0–10.5)
nRBC: 0 % (ref 0.0–0.2)

## 2023-04-03 LAB — COMPREHENSIVE METABOLIC PANEL
ALT: 20 U/L (ref 0–44)
AST: 27 U/L (ref 15–41)
Albumin: 4.1 g/dL (ref 3.5–5.0)
Alkaline Phosphatase: 79 U/L (ref 38–126)
Anion gap: 6 (ref 5–15)
BUN: 9 mg/dL (ref 8–23)
CO2: 28 mmol/L (ref 22–32)
Calcium: 9.2 mg/dL (ref 8.9–10.3)
Chloride: 94 mmol/L — ABNORMAL LOW (ref 98–111)
Creatinine, Ser: 0.71 mg/dL (ref 0.44–1.00)
GFR, Estimated: >60 mL/min (ref >60)
Glucose, Bld: 102 mg/dL — ABNORMAL HIGH (ref 70–99)
Potassium: 4.1 mmol/L (ref 3.5–5.1)
Sodium: 128 mmol/L — ABNORMAL LOW (ref 135–145)
Total Bilirubin: 0.4 mg/dL (ref 0.3–1.2)
Total Protein: 6.9 g/dL (ref 6.5–8.1)

## 2023-04-03 MED ORDER — INFLUENZA VAC A&B SURF ANT ADJ 0.5 ML IM SUSY
0.5000 mL | PREFILLED_SYRINGE | Freq: Once | INTRAMUSCULAR | Status: AC
  Administered 2023-04-03: 0.5 mL via INTRAMUSCULAR
  Filled 2023-04-03: qty 0.5

## 2023-04-03 MED ORDER — DENOSUMAB 60 MG/ML ~~LOC~~ SOSY
60.0000 mg | PREFILLED_SYRINGE | Freq: Once | SUBCUTANEOUS | Status: AC
  Administered 2023-04-03: 60 mg via SUBCUTANEOUS
  Filled 2023-04-03: qty 1

## 2023-04-03 NOTE — Progress Notes (Signed)
[facility]  [phone] [phone]) [phone]  ID: Honestee Coppler Stucke OB: 1950-12-01  MR#: [id number]  ATF#:[id number]  Patient Care Team: Lauro Regulus, MD as PCP - General (Internal Medicine) Alinda Dooms, NP as Nurse Practitioner (Nurse Practitioner)  CHIEF COMPLAINT: Stage IA ER/PR positive, HER-2 negative invasive carcinoma of the upper-outer quadrant of the left breast.  INTERVAL HISTORY: Patient is 72 year old female returns to clinic for routine 32-month evaluation and consideration of Prolia.  She completed letrozole in 2020.   She is doing really well. She continues to exercise daily and has continued her health diet adjustments. She continues to perform intermittent self breast exams and denies new lumps or bumps. She denies any dental concerns- she goes to the dentist twice a year and denies cavities or infections. No pain, falls, or fractures. No unintentional weight loss or night sweats.   Wt Readings from Last 3 Encounters:  04/03/23 115 lb (52.2 kg)  09/29/22 117 lb (53.1 kg)  03/31/22 116 lb (52.6 kg)   REVIEW OF SYSTEMS:   Review of Systems  Constitutional:  Negative for chills, fever, malaise/fatigue and weight loss.  HENT:  Negative for hearing loss, nosebleeds, sore throat and tinnitus.   Eyes:  Negative for blurred vision and double vision.  Respiratory:  Negative for cough, hemoptysis, shortness of breath and wheezing.   Cardiovascular:  Negative for chest pain, palpitations and leg swelling.  Gastrointestinal:  Negative for abdominal pain, blood in stool, constipation, diarrhea, melena, nausea and vomiting.  Genitourinary:  Negative for dysuria and urgency.  Musculoskeletal:  Negative for back pain, falls, joint pain and myalgias.  Skin:  Negative for itching and rash.  Neurological:  Positive for sensory change (balls of feet and fingertips). Negative for dizziness, tingling, loss of consciousness, weakness and headaches.   Endo/Heme/Allergies:  Negative for environmental allergies. Does not bruise/bleed easily.  Psychiatric/Behavioral:  Negative for depression. The patient is not nervous/anxious and does not have insomnia.   As per HPI. Otherwise, a complete review of systems is negative.  PAST MEDICAL HISTORY: Past Medical History:  Diagnosis Date   Breast cancer (HCC) 2014   left breast/radiation and chemo   Breast cancer, left breast (HCC)    History of bone density study 06/2014   History of colonoscopy 2012   History of mammogram 06/2014   History of Papanicolaou smear of cervix 2013   Hypercholesteremia    Hypertension    Personal history of chemotherapy    Personal history of radiation therapy     PAST SURGICAL HISTORY: Past Surgical History:  Procedure Laterality Date   BREAST BIOPSY Left 06/01/2013   invasive mammary carcinoma   BREAST LUMPECTOMY Left 06/21/2013   grade II invasive mammary carcinoma, clear margins, LN negative   PORT-A-CATH REMOVAL  11/15/2014   PORTACATH PLACEMENT     TUBAL LIGATION      FAMILY HISTORY: Family History  Problem Relation Age of Onset   Breast cancer Cousin 69   Cancer Paternal Uncle        lung   Breast cancer Paternal Aunt 7    ADVANCED DIRECTIVES (Y/N):  N  HEALTH MAINTENANCE: Social History   Tobacco Use   Smoking status: Never   Smokeless tobacco: Never  Substance Use Topics   Alcohol use: No    Alcohol/week: 0.0 standard drinks of alcohol   Drug use: No     Colonoscopy:  PAP:  Bone density:  Lipid panel:  Allergies  Allergen Reactions  Ace Inhibitors Cough and Other (See Comments)   Lisinopril     Other reaction(s): Cough    Current Outpatient Medications  Medication Sig Dispense Refill   amLODipine (NORVASC) 5 MG tablet Take 5 mg by mouth daily.     Calcium 600-200 MG-UNIT per tablet Take 2 tablets by mouth daily.     gabapentin (NEURONTIN) 100 MG capsule Take 100 mg by mouth 3 (three) times daily.     Omega-3  Fatty Acids (FISH OIL) 1000 MG CAPS Take 1 capsule by mouth daily.     pravastatin (PRAVACHOL) 40 MG tablet Take 40 mg by mouth daily.  0   No current facility-administered medications for this visit.   Facility-Administered Medications Ordered in Other Visits  Medication Dose Route Frequency Provider Last Rate Last Admin   denosumab (PROLIA) injection 60 mg  60 mg Subcutaneous Once Jeralyn Ruths, MD       influenza vaccine adjuvanted (FLUAD) injection 0.5 mL  0.5 mL Intramuscular Once Rushie Chestnut, PA-C        OBJECTIVE: Vitals:   04/03/23 0950  BP: 128/69  Pulse: 72  Temp: (!) 97.2 F (36.2 C)     Body mass index is 22.46 kg/m.    ECOG FS:0 - Asymptomatic  General: Thin build. Husband in lobby  Eyes: Pink conjunctiva, anicteric sclera. Lungs: Clear to auscultation bilaterally.  No audible wheezing or coughing Heart: Regular rate and rhythm. 3/6 heart murmur best auscultated at right sternal boarder. No peripheral edema Abdomen: Soft, nontender, nondistended.  Musculoskeletal: No edema, cyanosis, or clubbing. Neuro: Alert, answering all questions appropriately. Cranial nerves grossly intact. Skin: No rashes or petechiae noted. Psych: Normal affect. Breast: Patient deferred    LAB RESULTS:  Lab Results  Component Value Date   NA 128 (L) 04/03/2023   K 4.1 04/03/2023   CL 94 (L) 04/03/2023   CO2 28 04/03/2023   GLUCOSE 102 (H) 04/03/2023   BUN 9 04/03/2023   CREATININE 0.71 04/03/2023   CALCIUM 9.2 04/03/2023   PROT 6.9 04/03/2023   ALBUMIN 4.1 04/03/2023   AST 27 04/03/2023   ALT 20 04/03/2023   ALKPHOS 79 04/03/2023   BILITOT 0.4 04/03/2023   GFRNONAA >60 04/03/2023   GFRAA >60 09/19/2019    Lab Results  Component Value Date   WBC 8.1 04/03/2023   NEUTROABS 5.9 04/03/2023   HGB 13.5 04/03/2023   HCT 39.6 04/03/2023   MCV 91.5 04/03/2023   PLT 293 04/03/2023    STUDIES:  02/20/2023- Mammogram BI-RADS Category 1   10/24/21- Bone  Density ASSESSMENT: The probability of a major osteoporotic fracture is 12.4% within the next ten years. The probability of a hip fracture is 3.1% within the next ten years. ASSESSMENT: The BMD measured at AP Spine L1-L2 is 0.896 g/cm2 with a T-score of -2.3. This patient is considered osteopenic according to World Health Organization [facility]) criteria. The scan quality is good. L-3 & 4 was excluded due to degenerative changes. Compared with prior study, there has been no significant change in the spine. Compared with prior study, there has been significant increase in the total hip.   ASSESSMENT: Stage IA ER/PR positive, HER-2 negative invasive carcinoma of the upper-outer quadrant of the left breast.  PLAN:    1. Stage IA ER/PR positive, HER-2 negative invasive carcinoma of the upper-outer quadrant of the left breast: Patient underwent lumpectomy on June 21, 2013.  She did not require chemotherapy, but did receive adjuvant XRT.  Patient completed  5 years of letrozole in October 2020.  Her most recent mammogram on February 20, 2023 was reported as BI-RADS 1.  Repeat in August 2025 suggested. She will continue annual breast cancer surveillance. Clinically NED.   2.  Osteopenia: Patient most recent bone density test showed improvement in her t score to -2.3. Persistent osteopenia, however, improved from prior. Labs reviewed and acceptable for prolia today. She denies dental pain or infections. Continues to see a dentist twice a year. She will continue calcium and vitamin d along with weight bearing exercise as tolerated. Applauded her on her efforts to exercise currently. Repeat bone density in 2025. She will rtc in 6 months for consideration of prolia. Per uptodate can consider 5-10 years of treatment duration.  Bone density test- T scores: 10/24/21  - 2.3 08/15/20-  -2.6 08/15/19-  -2.2  08/12/2016 -  -2.5 07/05/2014 -2.9  3. Newly recognized heart murmur. Likely AS. Discussed with patient my lower  level of concern given lack of symptoms. Likely age related. Cardiology referral placed.    Disposition: Prolia today Cardiology referral placed DEXA order placed August 2025- mammogram 6 mo- lab, (cbc, cmp, ca27.29), Lauren, +/- prolia  I spent a total of 20 minutes reviewing chart data, face-to-face evaluation with the patient, counseling and coordination of care as detailed above.  Patient expressed understanding and was in agreement with this plan. She also understands that She can call clinic at any time with any questions, concerns, or complaints.    Cancer Staging  Breast cancer of upper-outer quadrant of left female breast [facility]) Staging form: Breast, AJCC 7th Edition - Clinical stage from 06/21/2013: Stage IA (T1b, N0, M0) - Signed by Jeralyn Ruths, MD on 09/17/2019 Laterality: Left Tumor size (mm): 7 Method of lymph node assessment: Sentinel lymph node biopsy Histologic grade (G): G2 Histologic grading system: 3 grade system Lymph-vascular invasion (LVI): LVI not present (absent)/not identified Residual tumor (R): R0 - None Paget's disease: Negative Tumor grade (Scarff-Bloom-Richardson system): G2 Estrogen receptor status: Positive Estrogen receptor test method: IHC Intensity of estrogen receptor staining: Strong Percentage of positive estrogen receptors (%): 90 Progesterone receptor status: Positive Progesterone receptor test method: IHC Intensity of progesterone receptor staining: Weak Percentage of positive progesterone receptors (%): 1 HER2 status: Negative IHC of regional lymph nodes: Negative Results for molecular studies of regional lymph nodes: Negative Circulating tumor cells (CTC): Not assessed Disseminated tumor cells: Not assessed Multi-gene signature risk of recurrence: Intermediate risk Multi-gene signature score: [address]  Rushie Chestnut, PA-C   04/03/2023

## 2023-04-03 NOTE — Progress Notes (Addendum)
1042-Patient got up out of chair after her injections. Ambulated about 10 feet down the hallway and she fell on her knees. No apparent injury. Pt assessed by Maralyn Sago, PA. Bp taking 108/87. HR 93. Pt sat in chair for 10 mins. Repeat bp 138/71. Pt did not feel dizzy/lightheaded. Declined water. Stated that she was "ok" pt d/c from clinic after 10 mins observation post fall. Stated that she was "ok" pt d/c from clinic after 10 mins observation post fall. educated patient to slowly get up when changing positions as she is on an antihypertensive.

## 2023-04-04 LAB — CANCER ANTIGEN 27.29: CA 27.29: 39 U/mL — ABNORMAL HIGH (ref 0.0–38.6)

## 2023-04-06 ENCOUNTER — Telehealth: Payer: Self-pay | Admitting: *Deleted

## 2023-04-06 NOTE — Telephone Encounter (Signed)
Patient called asking to speak with Clent Jacks, PA due to her cardiology appointment not being scheduled until 11/25 wanting to know if she can continue exercising walking to  treadmill and riding the bike. Please return her call [phone]

## 2023-04-10 ENCOUNTER — Encounter: Payer: Self-pay | Admitting: Oncology

## 2023-05-25 DIAGNOSIS — D3132 Benign neoplasm of left choroid: Secondary | ICD-10-CM | POA: Diagnosis not present

## 2023-06-14 DIAGNOSIS — R011 Cardiac murmur, unspecified: Secondary | ICD-10-CM | POA: Insufficient documentation

## 2023-06-14 NOTE — Progress Notes (Unsigned)
Cardiology Office Note  Date:  06/15/2023   ID:  Raven Barber, DOB June 04, 1951, MRN [id number]  PCP:  Raven Regulus, MD   Chief Complaint  Patient presents with   New Patient (Initial Visit)    Ref by Raven Jacks, PA for a newly recognized heart murmur. Medications reviewed by the patient verbally.     HPI:  [name] is a 72 year old woman with past medical history of Hypertension Hyperlipidemia Breast cancer, chemo 2015, chemo, XRT on left Who presents by referral from Raven Barber for heart murmur, concern for aortic valve stenosis  Feels well, Active, uses treadmill and eliptical, bike 4 days/week No SOB or chest pain with exercise  In terms of cardiac risk factors No smoking No diabetes Total chol 201, LDL 108, on pravastatin No prior cardiac screening studies  EKG personally reviewed by myself on todays visit EKG Interpretation Date/Time:  Monday June 15 2023 11:04:31 EST Ventricular Rate:  87 PR Interval:  130 QRS Duration:  48 QT Interval:  354 QTC Calculation: 425 R Axis:   6  Text Interpretation: Sinus rhythm with Premature supraventricular complexes and with occasional Premature ventricular complexes When compared with ECG of 20-Jun-2013 11:34, Premature ventricular complexes are now Present Premature supraventricular complexes are now Present Questionable change in QRS axis Confirmed by Julien Nordmann [phone]) on 06/15/2023 11:28:58 AM   Family hx: mother with CHF, father with CAD, MI Sister with MI (smoker) 107  PMH:   has a past medical history of Breast cancer (HCC) (2014), Breast cancer, left breast (HCC), History of bone density study (06/2014), History of colonoscopy (2012), History of mammogram (06/2014), History of Papanicolaou smear of cervix (2013), Hypercholesteremia, Hypertension, Personal history of chemotherapy, and Personal history of radiation therapy.  PSH:    Past Surgical History:  Procedure Laterality Date    BREAST BIOPSY Left 06/01/2013   invasive mammary carcinoma   BREAST LUMPECTOMY Left 06/21/2013   grade II invasive mammary carcinoma, clear margins, LN negative   PORT-A-CATH REMOVAL  11/15/2014   PORTACATH PLACEMENT     TUBAL LIGATION      Current Outpatient Medications  Medication Sig Dispense Refill   amLODipine (NORVASC) 5 MG tablet Take 5 mg by mouth daily.     Calcium 600-200 MG-UNIT per tablet Take 2 tablets by mouth daily.     gabapentin (NEURONTIN) 100 MG capsule Take 100 mg by mouth 3 (three) times daily.     Omega-3 Fatty Acids (FISH OIL) 1000 MG CAPS Take 1 capsule by mouth daily.     pravastatin (PRAVACHOL) 40 MG tablet Take 40 mg by mouth daily.  0   No current facility-administered medications for this visit.     Allergies:   Ace inhibitors and Lisinopril   Social History:  The patient  reports that she has never smoked. She has never used smokeless tobacco. She reports that she does not drink alcohol and does not use drugs.   Family History:   family history includes Breast cancer (age of onset: 64) in her cousin; Breast cancer (age of onset: 27) in her paternal aunt; Cancer in her paternal uncle; Heart attack (age of onset: 40) in her father; Heart failure in her mother; Hyperlipidemia in her father; Hypertension in her father.    Review of Systems: Review of Systems  Constitutional: Negative.   HENT: Negative.    Respiratory: Negative.    Cardiovascular: Negative.   Gastrointestinal: Negative.   Musculoskeletal: Negative.   Neurological: Negative.  Psychiatric/Behavioral: Negative.    All other systems reviewed and are negative.    PHYSICAL EXAM: VS:  BP 122/60 (BP Location: Left Arm, Patient Position: Sitting, Cuff Size: Normal)   Pulse 87   Ht 5' (1.524 m)   Wt 114 lb 4 oz (51.8 kg)   SpO2 96%   BMI 22.31 kg/m  , BMI Body mass index is 22.31 kg/m. GEN: Well nourished, well developed, in no acute distress HEENT: normal Neck: no JVD, carotid  bruits, or masses Cardiac: RRR; 2/6 SEM RSB, no rubs, gallops,no edema  Respiratory:  clear to auscultation bilaterally, normal work of breathing GI: soft, nontender, nondistended, + BS MS: no deformity or atrophy Skin: warm and dry, no rash Neuro:  Strength and sensation are intact Psych: euthymic mood, full affect   Recent Labs: 04/03/2023: ALT 20; BUN 9; Creatinine, Ser 0.71; Hemoglobin 13.5; Platelets 293; Potassium 4.1; Sodium 128    Lipid Panel No results found for: "CHOL", "HDL", "LDLCALC", "TRIG"    Wt Readings from Last 3 Encounters:  06/15/23 114 lb 4 oz (51.8 kg)  04/03/23 115 lb (52.2 kg)  09/29/22 117 lb (53.1 kg)       ASSESSMENT AND PLAN:  Problem List Items Addressed This Visit       Cardiology Problems   HLD (hyperlipidemia)   Benign hypertension - Primary   Relevant Orders   EKG 12-Lead (Completed)     Other   Heart murmur   Relevant Orders   EKG 12-Lead (Completed)   Cardiac murmur Low-grade, 2/6 systolic ejection murmur right sternal border radiating to left sternal border, unable to exclude aortic valve sclerosis, possibly mild stenosis Consider tricuspid valve regurgitation even mitral valve disease Echocardiogram ordered for further evaluation Asymptomatic No limitations to her exercise, discussed with her  Hyperlipidemia On pravastatin, total cholesterol ranging from 170 up to 220 Has been concerned about her ice cream habit Stressed the importance that she continue her exercise, moderation of ice cream and desserts Could consider CT coronary calcium scoring for risk stratification  History of breast cancer Radiation on left, prior chemo Will need to treat cholesterol aggressively given prior radiation on the left  Essential hypertension Blood pressure is well controlled on today's visit. No changes made to the medications.    Signed, Dossie Arbour, M.D., Ph.D. [facility] [place], [place] [phone]

## 2023-06-15 ENCOUNTER — Encounter: Payer: Self-pay | Admitting: Cardiovascular Disease

## 2023-06-15 ENCOUNTER — Ambulatory Visit: Payer: PPO | Attending: Cardiovascular Disease | Admitting: Cardiovascular Disease

## 2023-06-15 VITALS — BP 122/60 | HR 87 | Ht 60.0 in | Wt 114.2 lb

## 2023-06-15 DIAGNOSIS — E782 Mixed hyperlipidemia: Secondary | ICD-10-CM | POA: Diagnosis not present

## 2023-06-15 DIAGNOSIS — I10 Essential (primary) hypertension: Secondary | ICD-10-CM | POA: Diagnosis not present

## 2023-06-15 DIAGNOSIS — R011 Cardiac murmur, unspecified: Secondary | ICD-10-CM | POA: Diagnosis not present

## 2023-06-15 DIAGNOSIS — Z853 Personal history of malignant neoplasm of breast: Secondary | ICD-10-CM | POA: Diagnosis not present

## 2023-06-15 NOTE — Patient Instructions (Addendum)
Medication Instructions:  No changes  If you need a refill on your cardiac medications before your next appointment, please call your pharmacy.   Lab work: No new labs needed  Testing/Procedures: Your physician has requested that you have an echocardiogram. Echocardiography is a painless test that uses sound waves to create images of your heart. It provides your doctor with information about the size and shape of your heart and how well your heart's chambers and valves are working.   You may receive an ultrasound enhancing agent through an IV if needed to better visualize your heart during the echo. This procedure takes approximately one hour.  There are no restrictions for this procedure.  This will take place at [address] [facility]) [address], [place] [address]  Please note: We ask at that you not bring children with you during ultrasound (echo/ vascular) testing. Due to room size and safety concerns, children are not allowed in the ultrasound rooms during exams. Our front office staff cannot provide observation of children in our lobby area while testing is being conducted. An adult accompanying a patient to their appointment will only be allowed in the ultrasound room at the discretion of the ultrasound technician under special circumstances. We apologize for any inconvenience.   Follow-Up: At [facility], you and your health needs are our priority.  As part of our continuing mission to provide you with exceptional heart care, we have created designated Provider Care Teams.  These Care Teams include your primary Cardiologist (physician) and Advanced Practice Providers (APPs -  Physician Assistants and Nurse Practitioners) who all work together to provide you with the care you need, when you need it.  You will need a follow up appointment in 12 months  Providers on your designated Care Team:   Nicolasa Ducking, NP Eula Listen, PA-C Cadence Fransico Michael, [place]  COVID-19  Vaccine Information can be found at: [email or web address] For questions related to vaccine distribution or appointments, please email [email or web address] or call [phone].

## 2023-06-16 ENCOUNTER — Ambulatory Visit
Admission: RE | Admit: 2023-06-16 | Discharge: 2023-06-16 | Disposition: A | Discharge status: Home / Self Care | Payer: PPO | Source: Ambulatory Visit | Attending: Medical Oncology | Admitting: Medical Oncology

## 2023-06-16 DIAGNOSIS — Z780 Asymptomatic menopausal state: Secondary | ICD-10-CM | POA: Diagnosis not present

## 2023-06-16 DIAGNOSIS — M810 Age-related osteoporosis without current pathological fracture: Secondary | ICD-10-CM | POA: Diagnosis not present

## 2023-06-16 DIAGNOSIS — M8588 Other specified disorders of bone density and structure, other site: Secondary | ICD-10-CM | POA: Diagnosis not present

## 2023-07-07 ENCOUNTER — Ambulatory Visit: Payer: PPO | Attending: Cardiovascular Disease

## 2023-07-07 DIAGNOSIS — R011 Cardiac murmur, unspecified: Secondary | ICD-10-CM | POA: Diagnosis not present

## 2023-07-07 LAB — ECHOCARDIOGRAM COMPLETE
AR max vel: 1.05 cm2
AV Area VTI: 1.08 cm2
AV Area mean vel: 1.05 cm2
AV Mean grad: 13 mm[Hg]
AV Peak grad: 24.2 mm[Hg]
Ao pk vel: 2.46 m/s
Area-P 1/2: 4.49 cm2
S' Lateral: 2.9 cm

## 2023-07-13 ENCOUNTER — Encounter: Payer: Self-pay | Admitting: Emergency Medicine

## 2023-08-07 DIAGNOSIS — I10 Essential (primary) hypertension: Secondary | ICD-10-CM | POA: Diagnosis not present

## 2023-08-07 DIAGNOSIS — E7800 Pure hypercholesterolemia, unspecified: Secondary | ICD-10-CM | POA: Diagnosis not present

## 2023-08-14 DIAGNOSIS — T451X5A Adverse effect of antineoplastic and immunosuppressive drugs, initial encounter: Secondary | ICD-10-CM | POA: Diagnosis not present

## 2023-08-14 DIAGNOSIS — I350 Nonrheumatic aortic (valve) stenosis: Secondary | ICD-10-CM | POA: Diagnosis not present

## 2023-08-14 DIAGNOSIS — G620 Drug-induced polyneuropathy: Secondary | ICD-10-CM | POA: Diagnosis not present

## 2023-08-14 DIAGNOSIS — E7800 Pure hypercholesterolemia, unspecified: Secondary | ICD-10-CM | POA: Diagnosis not present

## 2023-08-14 DIAGNOSIS — I10 Essential (primary) hypertension: Secondary | ICD-10-CM | POA: Diagnosis not present

## 2023-08-24 ENCOUNTER — Other Ambulatory Visit: Payer: Self-pay

## 2023-08-24 DIAGNOSIS — Z170 Estrogen receptor positive status [ER+]: Secondary | ICD-10-CM

## 2023-09-30 ENCOUNTER — Inpatient Hospital Stay: Payer: PPO | Admitting: Oncology

## 2023-09-30 ENCOUNTER — Inpatient Hospital Stay: Payer: PPO | Attending: Oncology

## 2023-09-30 ENCOUNTER — Inpatient Hospital Stay: Payer: PPO

## 2023-09-30 ENCOUNTER — Encounter: Payer: Self-pay | Admitting: Oncology

## 2023-09-30 VITALS — BP 138/70 | HR 68 | Temp 98.7°F | Resp 14 | Wt 110.0 lb

## 2023-09-30 DIAGNOSIS — M810 Age-related osteoporosis without current pathological fracture: Secondary | ICD-10-CM

## 2023-09-30 DIAGNOSIS — C50412 Malignant neoplasm of upper-outer quadrant of left female breast: Secondary | ICD-10-CM

## 2023-09-30 DIAGNOSIS — Z170 Estrogen receptor positive status [ER+]: Secondary | ICD-10-CM

## 2023-09-30 DIAGNOSIS — M8580 Other specified disorders of bone density and structure, unspecified site: Secondary | ICD-10-CM | POA: Diagnosis not present

## 2023-09-30 DIAGNOSIS — Z853 Personal history of malignant neoplasm of breast: Secondary | ICD-10-CM | POA: Diagnosis not present

## 2023-09-30 LAB — CBC WITH DIFFERENTIAL (CANCER CENTER ONLY)
Abs Immature Granulocytes: 0.02 10*3/uL (ref 0.00–0.07)
Basophils Absolute: 0.1 10*3/uL (ref 0.0–0.1)
Basophils Relative: 1 %
Eosinophils Absolute: 0.1 10*3/uL (ref 0.0–0.5)
Eosinophils Relative: 1 %
HCT: 39.2 % (ref 36.0–46.0)
Hemoglobin: 13.4 g/dL (ref 12.0–15.0)
Immature Granulocytes: 0 %
Lymphocytes Relative: 19 %
Lymphs Abs: 1.3 10*3/uL (ref 0.7–4.0)
MCH: 31.2 pg (ref 26.0–34.0)
MCHC: 34.2 g/dL (ref 30.0–36.0)
MCV: 91.2 fL (ref 80.0–100.0)
Monocytes Absolute: 0.7 10*3/uL (ref 0.1–1.0)
Monocytes Relative: 10 %
Neutro Abs: 4.8 10*3/uL (ref 1.7–7.7)
Neutrophils Relative %: 69 %
Platelet Count: 328 10*3/uL (ref 150–400)
RBC: 4.3 MIL/uL (ref 3.87–5.11)
RDW: 13.2 % (ref 11.5–15.5)
WBC Count: 6.9 10*3/uL (ref 4.0–10.5)
nRBC: 0 % (ref 0.0–0.2)

## 2023-09-30 LAB — CMP (CANCER CENTER ONLY)
ALT: 18 U/L (ref 0–44)
AST: 28 U/L (ref 15–41)
Albumin: 4.2 g/dL (ref 3.5–5.0)
Alkaline Phosphatase: 86 U/L (ref 38–126)
Anion gap: 8 (ref 5–15)
BUN: 10 mg/dL (ref 8–23)
CO2: 29 mmol/L (ref 22–32)
Calcium: 9.4 mg/dL (ref 8.9–10.3)
Chloride: 95 mmol/L — ABNORMAL LOW (ref 98–111)
Creatinine: 0.65 mg/dL (ref 0.44–1.00)
GFR, Estimated: >60 mL/min (ref >60)
Glucose, Bld: 102 mg/dL — ABNORMAL HIGH (ref 70–99)
Potassium: 4 mmol/L (ref 3.5–5.1)
Sodium: 132 mmol/L — ABNORMAL LOW (ref 135–145)
Total Bilirubin: 0.6 mg/dL (ref 0.0–1.2)
Total Protein: 7.2 g/dL (ref 6.5–8.1)

## 2023-09-30 MED ORDER — DENOSUMAB 60 MG/ML ~~LOC~~ SOSY
60.0000 mg | PREFILLED_SYRINGE | Freq: Once | SUBCUTANEOUS | Status: AC
Start: 2023-09-30 — End: 2023-09-30
  Administered 2023-09-30: 60 mg via SUBCUTANEOUS
  Filled 2023-09-30: qty 1

## 2023-09-30 NOTE — Progress Notes (Signed)
 Patient says that she is doing good, she has no new questions or concerns for the doctor today.

## 2023-09-30 NOTE — Progress Notes (Signed)
 [facility]  Telephone:([phone] Fax:(336) [phone]  ID: Raven Barber OB: 1951/01/12  MR#: [id number]  UXL#:[id number]  Patient Care Team: Lauro Regulus, MD as PCP - General (Internal Medicine) Alinda Dooms, NP as Nurse Practitioner (Nurse Practitioner)  CHIEF COMPLAINT: Stage IA ER/PR positive, HER-2 negative invasive carcinoma of the upper-outer quadrant of the left breast.  INTERVAL HISTORY: Patient returns to clinic today for further evaluation and continuation of Prolia.  She continues to feel well and remains asymptomatic. She has no neurologic complaints.  She denies any recent fevers or illnesses.  She has a good appetite and denies weight loss.  She denies any chest pain, shortness of breath, cough, or hemoptysis.  She denies any nausea, vomiting, constipation, or diarrhea.  She has no urinary complaints.  Patient offers no specific complaints today.    REVIEW OF SYSTEMS:   Review of Systems  Constitutional: Negative.  Negative for fever, malaise/fatigue and weight loss.  Respiratory: Negative.  Negative for cough, hemoptysis and shortness of breath.   Cardiovascular: Negative.  Negative for chest pain and leg swelling.  Gastrointestinal: Negative.  Negative for abdominal pain, blood in stool and melena.  Genitourinary: Negative.  Negative for dysuria.  Musculoskeletal: Negative.  Negative for back pain.  Skin: Negative.  Negative for rash.  Neurological: Negative.  Negative for focal weakness, weakness and headaches.  Psychiatric/Behavioral: Negative.  The patient is not nervous/anxious.     As per HPI. Otherwise, a complete review of systems is negative.  PAST MEDICAL HISTORY: Past Medical History:  Diagnosis Date   Breast cancer (HCC) 2014   left breast/radiation and chemo   Breast cancer, left breast (HCC)    History of bone density study 06/2014   History of colonoscopy 2012   History of mammogram 06/2014   History of Papanicolaou  smear of cervix 2013   Hypercholesteremia    Hypertension    Personal history of chemotherapy    Personal history of radiation therapy     PAST SURGICAL HISTORY: Past Surgical History:  Procedure Laterality Date   BREAST BIOPSY Left 06/01/2013   invasive mammary carcinoma   BREAST LUMPECTOMY Left 06/21/2013   grade II invasive mammary carcinoma, clear margins, LN negative   PORT-A-CATH REMOVAL  11/15/2014   PORTACATH PLACEMENT     TUBAL LIGATION      FAMILY HISTORY: Family History  Problem Relation Age of Onset   Heart failure Mother    Hyperlipidemia Father    Hypertension Father    Heart attack Father 89   Breast cancer Paternal Aunt 20   Cancer Paternal Uncle        lung   Breast cancer Cousin 40    ADVANCED DIRECTIVES (Y/N):  N  HEALTH MAINTENANCE: Social History   Tobacco Use   Smoking status: Never   Smokeless tobacco: Never  Vaping Use   Vaping status: Never Used  Substance Use Topics   Alcohol use: No    Alcohol/week: 0.0 standard drinks of alcohol   Drug use: No     Colonoscopy:  PAP:  Bone density:  Lipid panel:  Allergies  Allergen Reactions   Ace Inhibitors Cough and Other (See Comments)   Lisinopril     Other reaction(s): Cough    Current Outpatient Medications  Medication Sig Dispense Refill   amLODipine (NORVASC) 5 MG tablet Take 5 mg by mouth daily.     Calcium 600-200 MG-UNIT per tablet Take 2 tablets by mouth daily.  gabapentin (NEURONTIN) 100 MG capsule Take 100 mg by mouth 3 (three) times daily.     Omega-3 Fatty Acids (FISH OIL) 1000 MG CAPS Take 1 capsule by mouth daily.     pravastatin (PRAVACHOL) 40 MG tablet Take 40 mg by mouth daily.  0   No current facility-administered medications for this visit.    OBJECTIVE: Vitals:   09/30/23 0934  BP: 138/70  Pulse: 68  Resp: 14  Temp: 98.7 F (37.1 C)  SpO2: 100%     Body mass index is 21.48 kg/m.    ECOG FS:0 - Asymptomatic  General: Well-developed, well-nourished,  no acute distress. Eyes: Pink conjunctiva, anicteric sclera. HEENT: Normocephalic, moist mucous membranes. Lungs: No audible wheezing or coughing. Heart: Regular rate and rhythm. Abdomen: Soft, nontender, no obvious distention. Musculoskeletal: No edema, cyanosis, or clubbing. Neuro: Alert, answering all questions appropriately. Cranial nerves grossly intact. Skin: No rashes or petechiae noted. Psych: Normal affect.  LAB RESULTS:  Lab Results  Component Value Date   NA 128 (L) 04/03/2023   K 4.1 04/03/2023   CL 94 (L) 04/03/2023   CO2 28 04/03/2023   GLUCOSE 102 (H) 04/03/2023   BUN 9 04/03/2023   CREATININE 0.71 04/03/2023   CALCIUM 9.2 04/03/2023   PROT 6.9 04/03/2023   ALBUMIN 4.1 04/03/2023   AST 27 04/03/2023   ALT 20 04/03/2023   ALKPHOS 79 04/03/2023   BILITOT 0.4 04/03/2023   GFRNONAA >60 04/03/2023   GFRAA >60 09/19/2019    Lab Results  Component Value Date   WBC 6.9 09/30/2023   NEUTROABS 4.8 09/30/2023   HGB 13.4 09/30/2023   HCT 39.2 09/30/2023   MCV 91.2 09/30/2023   PLT 328 09/30/2023     STUDIES: No results found.  ASSESSMENT: Stage IA ER/PR positive, HER-2 negative invasive carcinoma of the upper-outer quadrant of the left breast.  PLAN:    Stage IA ER/PR positive, HER-2 negative invasive carcinoma of the upper-outer quadrant of the left breast: Patient underwent lumpectomy on June 21, 2013.  She did not require chemotherapy, but did receive adjuvant XRT.  Patient completed 5 years of letrozole in October 2020.  Her most recent mammogram on February 23, 2023 was reported as BI-RADS 1.  Repeat in August 2025.   Osteopenia: Patient's most recent bone mineral density on June 16, 2023 reported T-score of -2.0 which continues to improve over prior years.  Proceed with Prolia today.  Continue calcium and vitamin D supplementation.  Patient will repeat bone mineral density in November 2025 and follow-up several days later.  If continued improvement,  consider discontinuing Prolia.    I spent a total of 30 minutes reviewing chart data, face-to-face evaluation with the patient, counseling and coordination of care as detailed above.    Patient expressed understanding and was in agreement with this plan. She also understands that She can call clinic at any time with any questions, concerns, or complaints.    Cancer Staging  Breast cancer of upper-outer quadrant of left female breast [facility]) Staging form: Breast, AJCC 7th Edition - Clinical stage from 06/21/2013: Stage IA (T1b, N0, M0) - Signed by Jeralyn Ruths, MD on 09/17/2019 Laterality: Left Tumor size (mm): 7 Method of lymph node assessment: Sentinel lymph node biopsy Histologic grade (G): G2 Histologic grading system: 3 grade system Lymph-vascular invasion (LVI): LVI not present (absent)/not identified Residual tumor (R): R0 Paget's disease: Negative Tumor grade (Scarff-Bloom-Richardson system): G2 Estrogen receptor status: Positive Estrogen receptor test method: IHC Intensity of  estrogen receptor staining: Strong Percentage of positive estrogen receptors (%): 90 Progesterone receptor status: Positive Progesterone receptor test method: IHC Intensity of progesterone receptor staining: Weak Percentage of positive progesterone receptors (%): 1 HER2 status: Negative IHC of regional lymph nodes: Negative Results for molecular studies of regional lymph nodes: Negative Circulating tumor cells (CTC): Not assessed Disseminated tumor cells: Not assessed Multi-gene signature risk of recurrence: Intermediate risk Multi-gene signature score: 21   Jeralyn Ruths, MD   09/30/2023 9:46 AM

## 2023-10-01 ENCOUNTER — Ambulatory Visit: Payer: PPO

## 2023-10-01 ENCOUNTER — Ambulatory Visit: Payer: PPO | Admitting: Internal Medicine

## 2023-10-01 ENCOUNTER — Other Ambulatory Visit: Payer: Self-pay | Admitting: *Deleted

## 2023-10-01 ENCOUNTER — Other Ambulatory Visit: Payer: PPO

## 2023-10-01 DIAGNOSIS — C50412 Malignant neoplasm of upper-outer quadrant of left female breast: Secondary | ICD-10-CM

## 2023-10-01 LAB — CANCER ANTIGEN 27.29: CA 27.29: 34.3 U/mL (ref 0.0–38.6)

## 2024-02-09 DIAGNOSIS — I10 Essential (primary) hypertension: Secondary | ICD-10-CM | POA: Diagnosis not present

## 2024-02-09 DIAGNOSIS — E7800 Pure hypercholesterolemia, unspecified: Secondary | ICD-10-CM | POA: Diagnosis not present

## 2024-02-16 DIAGNOSIS — E7800 Pure hypercholesterolemia, unspecified: Secondary | ICD-10-CM | POA: Diagnosis not present

## 2024-02-16 DIAGNOSIS — Z0000 Encounter for general adult medical examination without abnormal findings: Secondary | ICD-10-CM | POA: Diagnosis not present

## 2024-02-16 DIAGNOSIS — I10 Essential (primary) hypertension: Secondary | ICD-10-CM | POA: Diagnosis not present

## 2024-02-16 DIAGNOSIS — M810 Age-related osteoporosis without current pathological fracture: Secondary | ICD-10-CM | POA: Diagnosis not present

## 2024-02-16 DIAGNOSIS — C50412 Malignant neoplasm of upper-outer quadrant of left female breast: Secondary | ICD-10-CM | POA: Diagnosis not present

## 2024-02-16 DIAGNOSIS — Z1331 Encounter for screening for depression: Secondary | ICD-10-CM | POA: Diagnosis not present

## 2024-02-16 DIAGNOSIS — G620 Drug-induced polyneuropathy: Secondary | ICD-10-CM | POA: Diagnosis not present

## 2024-02-16 DIAGNOSIS — T451X5A Adverse effect of antineoplastic and immunosuppressive drugs, initial encounter: Secondary | ICD-10-CM | POA: Diagnosis not present

## 2024-02-23 ENCOUNTER — Ambulatory Visit
Admission: RE | Admit: 2024-02-23 | Discharge: 2024-02-23 | Disposition: A | Discharge status: Home / Self Care | Source: Ambulatory Visit | Attending: Oncology | Admitting: Oncology

## 2024-02-23 DIAGNOSIS — Z1231 Encounter for screening mammogram for malignant neoplasm of breast: Secondary | ICD-10-CM | POA: Insufficient documentation

## 2024-02-23 DIAGNOSIS — Z170 Estrogen receptor positive status [ER+]: Secondary | ICD-10-CM | POA: Diagnosis present

## 2024-02-23 DIAGNOSIS — Z853 Personal history of malignant neoplasm of breast: Secondary | ICD-10-CM | POA: Insufficient documentation

## 2024-02-23 DIAGNOSIS — C50412 Malignant neoplasm of upper-outer quadrant of left female breast: Secondary | ICD-10-CM | POA: Diagnosis present

## 2024-05-25 ENCOUNTER — Other Ambulatory Visit: Payer: Self-pay | Admitting: *Deleted

## 2024-05-25 ENCOUNTER — Other Ambulatory Visit: Payer: Self-pay

## 2024-05-25 DIAGNOSIS — H53002 Unspecified amblyopia, left eye: Secondary | ICD-10-CM | POA: Diagnosis not present

## 2024-05-25 DIAGNOSIS — M810 Age-related osteoporosis without current pathological fracture: Secondary | ICD-10-CM

## 2024-05-25 DIAGNOSIS — D3132 Benign neoplasm of left choroid: Secondary | ICD-10-CM | POA: Diagnosis not present

## 2024-05-25 DIAGNOSIS — H2513 Age-related nuclear cataract, bilateral: Secondary | ICD-10-CM | POA: Diagnosis not present

## 2024-06-20 ENCOUNTER — Ambulatory Visit
Admission: RE | Admit: 2024-06-20 | Discharge: 2024-06-20 | Disposition: A | Source: Ambulatory Visit | Attending: Oncology | Admitting: Oncology

## 2024-06-20 ENCOUNTER — Ambulatory Visit

## 2024-06-20 ENCOUNTER — Ambulatory Visit: Admitting: Oncology

## 2024-06-20 DIAGNOSIS — M810 Age-related osteoporosis without current pathological fracture: Secondary | ICD-10-CM | POA: Diagnosis not present

## 2024-06-20 DIAGNOSIS — Z780 Asymptomatic menopausal state: Secondary | ICD-10-CM | POA: Diagnosis not present

## 2024-06-20 DIAGNOSIS — M8589 Other specified disorders of bone density and structure, multiple sites: Secondary | ICD-10-CM | POA: Diagnosis not present

## 2024-06-24 ENCOUNTER — Inpatient Hospital Stay: Admitting: Oncology

## 2024-06-24 ENCOUNTER — Inpatient Hospital Stay

## 2024-06-29 ENCOUNTER — Encounter: Payer: Self-pay | Admitting: Oncology

## 2024-06-29 ENCOUNTER — Inpatient Hospital Stay: Attending: Oncology | Admitting: Oncology

## 2024-06-29 ENCOUNTER — Inpatient Hospital Stay

## 2024-06-29 VITALS — BP 118/62 | HR 83 | Temp 97.3°F | Resp 16 | Wt 109.0 lb

## 2024-06-29 DIAGNOSIS — Z803 Family history of malignant neoplasm of breast: Secondary | ICD-10-CM | POA: Insufficient documentation

## 2024-06-29 DIAGNOSIS — Z923 Personal history of irradiation: Secondary | ICD-10-CM | POA: Insufficient documentation

## 2024-06-29 DIAGNOSIS — Z801 Family history of malignant neoplasm of trachea, bronchus and lung: Secondary | ICD-10-CM | POA: Insufficient documentation

## 2024-06-29 DIAGNOSIS — C50412 Malignant neoplasm of upper-outer quadrant of left female breast: Secondary | ICD-10-CM

## 2024-06-29 DIAGNOSIS — Z853 Personal history of malignant neoplasm of breast: Secondary | ICD-10-CM | POA: Insufficient documentation

## 2024-06-29 DIAGNOSIS — Z170 Estrogen receptor positive status [ER+]: Secondary | ICD-10-CM

## 2024-06-29 DIAGNOSIS — Z9221 Personal history of antineoplastic chemotherapy: Secondary | ICD-10-CM | POA: Insufficient documentation

## 2024-06-29 DIAGNOSIS — M810 Age-related osteoporosis without current pathological fracture: Secondary | ICD-10-CM

## 2024-06-29 DIAGNOSIS — M8580 Other specified disorders of bone density and structure, unspecified site: Secondary | ICD-10-CM | POA: Diagnosis not present

## 2024-06-29 MED ORDER — DENOSUMAB 60 MG/ML ~~LOC~~ SOSY
60.0000 mg | PREFILLED_SYRINGE | Freq: Once | SUBCUTANEOUS | Status: DC
  Filled 2024-06-29: qty 1

## 2024-06-29 NOTE — Progress Notes (Signed)
 Per provider hold Prolia  today

## 2024-06-29 NOTE — Progress Notes (Signed)
 [facility]  Telephone:(336) [phone] Fax:(336) [phone]  ID: Jennefer Kopp Sawa OB: [birth year]  MR#: [id number]  RDW#:[id number]  Patient Care Team: Lenon Layman ORN, MD as PCP - General (Internal Medicine) Dasie Tinnie MATSU, NP as Nurse Practitioner (Nurse Practitioner)  CHIEF COMPLAINT: History of stage IA ER/PR positive, HER-2 negative invasive carcinoma of the upper-outer quadrant of the left breast.  INTERVAL HISTORY: Patient returns to clinic today for further evaluation and consideration of additional Prolia .  She continues to feel well and remains asymptomatic.  She has no neurologic complaints.  She denies any recent fevers or illnesses.  She has a good appetite and denies weight loss.  She denies any chest pain, shortness of breath, cough, or hemoptysis.  She denies any nausea, vomiting, constipation, or diarrhea.  She has no urinary complaints.  Patient offers no specific complaints today.  REVIEW OF SYSTEMS:   Review of Systems  Constitutional: Negative.  Negative for fever, malaise/fatigue and weight loss.  Respiratory: Negative.  Negative for cough, hemoptysis and shortness of breath.   Cardiovascular: Negative.  Negative for chest pain and leg swelling.  Gastrointestinal: Negative.  Negative for abdominal pain, blood in stool and melena.  Genitourinary: Negative.  Negative for dysuria.  Musculoskeletal: Negative.  Negative for back pain.  Skin: Negative.  Negative for rash.  Neurological: Negative.  Negative for focal weakness, weakness and headaches.  Psychiatric/Behavioral: Negative.  The patient is not nervous/anxious.     As per HPI. Otherwise, a complete review of systems is negative.  PAST MEDICAL HISTORY: Past Medical History:  Diagnosis Date   Breast cancer (HCC) 2014   left breast/radiation and chemo   Breast cancer, left breast (HCC)    History of bone density study 06/2014   History of colonoscopy 2012   History of mammogram 06/2014    History of Papanicolaou smear of cervix 2013   Hypercholesteremia    Hypertension    Personal history of chemotherapy    Personal history of radiation therapy     PAST SURGICAL HISTORY: Past Surgical History:  Procedure Laterality Date   BREAST BIOPSY Left 06/01/2013   invasive mammary carcinoma   BREAST LUMPECTOMY Left 06/21/2013   grade II invasive mammary carcinoma, clear margins, LN negative   PORT-A-CATH REMOVAL  11/15/2014   PORTACATH PLACEMENT     TUBAL LIGATION      FAMILY HISTORY: Family History  Problem Relation Age of Onset   Heart failure Mother    Hyperlipidemia Father    Hypertension Father    Heart attack Father 50   Breast cancer Paternal Aunt 69   Cancer Paternal Uncle        lung   Breast cancer Cousin 73    ADVANCED DIRECTIVES (Y/N):  N  HEALTH MAINTENANCE: Social History   Tobacco Use   Smoking status: Never   Smokeless tobacco: Never  Vaping Use   Vaping status: Never Used  Substance Use Topics   Alcohol use: No    Alcohol/week: 0.0 standard drinks of alcohol   Drug use: No     Colonoscopy:  PAP:  Bone density:  Lipid panel:  Allergies  Allergen Reactions   Ace Inhibitors Cough and Other (See Comments)   Lisinopril     Other reaction(s): Cough    Current Outpatient Medications  Medication Sig Dispense Refill   amLODipine (NORVASC) 5 MG tablet Take 5 mg by mouth daily.     Calcium 600-200 MG-UNIT per tablet Take 2 tablets by mouth daily.  gabapentin  (NEURONTIN ) 100 MG capsule Take 100 mg by mouth 3 (three) times daily.     Omega-3 Fatty Acids (FISH OIL) 1000 MG CAPS Take 1 capsule by mouth daily.     pravastatin (PRAVACHOL) 40 MG tablet Take 40 mg by mouth daily.  0   No current facility-administered medications for this visit.   Facility-Administered Medications Ordered in Other Visits  Medication Dose Route Frequency Provider Last Rate Last Admin   denosumab  (PROLIA ) injection 60 mg  60 mg Subcutaneous Once Anija Brickner,  Lakoda Mcanany J, MD        OBJECTIVE: Vitals:   06/29/24 1436  BP: 118/62  Pulse: 83  Resp: 16  Temp: (!) 97.3 F (36.3 C)  SpO2: 100%     Body mass index is 21.29 kg/m.    ECOG FS:0 - Asymptomatic  General: Well-developed, well-nourished, no acute distress. Eyes: Pink conjunctiva, anicteric sclera. HEENT: Normocephalic, moist mucous membranes. Lungs: No audible wheezing or coughing. Heart: Regular rate and rhythm. Abdomen: Soft, nontender, no obvious distention. Musculoskeletal: No edema, cyanosis, or clubbing. Neuro: Alert, answering all questions appropriately. Cranial nerves grossly intact. Skin: No rashes or petechiae noted. Psych: Normal affect.  LAB RESULTS:  Lab Results  Component Value Date   NA 132 (L) 09/30/2023   K 4.0 09/30/2023   CL 95 (L) 09/30/2023   CO2 29 09/30/2023   GLUCOSE 102 (H) 09/30/2023   BUN 10 09/30/2023   CREATININE 0.65 09/30/2023   CALCIUM 9.4 09/30/2023   PROT 7.2 09/30/2023   ALBUMIN 4.2 09/30/2023   AST 28 09/30/2023   ALT 18 09/30/2023   ALKPHOS 86 09/30/2023   BILITOT 0.6 09/30/2023   GFRNONAA >60 09/30/2023   GFRAA >60 09/19/2019    Lab Results  Component Value Date   WBC 6.9 09/30/2023   NEUTROABS 4.8 09/30/2023   HGB 13.4 09/30/2023   HCT 39.2 09/30/2023   MCV 91.2 09/30/2023   PLT 328 09/30/2023     STUDIES: DG Bone Density Result Date: 06/20/2024 EXAM: DUAL X-RAY ABSORPTIOMETRY (DXA) FOR BONE MINERAL DENSITY 06/20/2024 9:55 am CLINICAL DATA:  73 year old Female Postmenopausal. Postmenopausal Patient is or has been on bone building therapies. TECHNIQUE: An axial (e.g., hips, spine) and/or appendicular (e.g., radius) exam was performed, as appropriate, using GE Secretary/administrator at [facility]. Images are obtained for bone mineral density measurement and are not obtained for diagnostic purposes. [id number] Exclusions: L3-L4 due to degenerative changes. COMPARISON:  06/16/2023. FINDINGS: Scan  quality: Good. LUMBAR SPINE (L1-L2): BMD (in g/cm2): 0.924 T-score: -2.1 Z-score: -0.3 Rate of change from previous exam: No significant rate of change from previous exam. LEFT FEMORAL NECK: BMD (in g/cm2): 0.805 T-score: -1.7 Z-score: 0.3 LEFT TOTAL HIP: BMD (in g/cm2): 0.876 T-score: -1.0 Z-score: 0.7 RIGHT FEMORAL NECK: BMD (in g/cm2): 0.810 T-score: -1.6 Z-score: 0.3 RIGHT TOTAL HIP: BMD (in g/cm2): 0.870 T-score: -1.1 Z-score: 0.7 DUAL-FEMUR TOTAL MEAN: Rate of change from previous exam: No significant rate of change from previous exam. FRAX 10-YEAR PROBABILITY OF FRACTURE: FRAX not reported as the patient is receiving bone building therapy. IMPRESSION: Osteopenia based on BMD. Fracture risk is unknown due to history of bone building therapy. RECOMMENDATIONS: 1. All patients should optimize calcium and vitamin D intake. 2. Consider FDA-approved medical therapies in postmenopausal women and men aged 14 years and older, based on the following: - A hip or vertebral (clinical or morphometric) fracture - T-score less than or equal to -2.5 and secondary causes have been excluded. -  Low bone mass (T-score between -1.0 and -2.5) and a 10-year probability of a hip fracture greater than or equal to 3% or a 10-year probability of a major osteoporosis-related fracture greater than or equal to 20% based on the [place] -adapted WHO algorithm. - Clinician judgment and/or patient preferences may indicate treatment for people with 10-year fracture probabilities above or below these levels 3. Patients with diagnosis of osteoporosis or at high risk for fracture should have regular bone mineral density tests. For patients eligible for Medicare, routine testing is allowed once every 2 years. The testing frequency can be increased to one year for patients who have rapidly progressing disease, those who are receiving or discontinuing medical therapy to restore bone mass, or have additional risk factors. Electronically Signed   By: Harrietta Sherry M.D.   On: 06/20/2024 10:08    ASSESSMENT: History of stage IA ER/PR positive, HER-2 negative invasive carcinoma of the upper-outer quadrant of the left breast.  PLAN:    Stage IA ER/PR positive, HER-2 negative invasive carcinoma of the upper-outer quadrant of the left breast: Patient underwent lumpectomy on June 21, 2013.  She did not require chemotherapy, but did receive adjuvant XRT.  Patient completed 5 years of letrozole  in October 2020.  Patient's most recent mammogram on February 23, 2024 was reported as BI-RADS 1.  Repeat in August 2026.  No further follow-up has been scheduled in the cancer center, so yearly screening mammograms can be ordered by her primary care provider.   Osteopenia: Patient's most recent bone mineral density on June 20, 2024 revealed no significant change from previous.  Prolia  has been discontinued.  Patient has been instructed to continue calcium and D vitamin supplementation.  Repeat bone mineral density in December 2026.  Continue monitoring and treatment per primary care.  Patient expressed understanding and was in agreement with this plan. She also understands that She can call clinic at any time with any questions, concerns, or complaints.    Cancer Staging  Breast cancer of upper-outer quadrant of left female breast [facility]) Staging form: Breast, AJCC 7th Edition - Clinical stage from 06/21/2013: Stage IA (T1b, N0(sn), M0) - Signed by Jacobo Evalene PARAS, MD on 09/17/2019 Laterality: Left Tumor size (mm): 7 Method of lymph node assessment: Sentinel lymph node biopsy Histologic grade (G): G2 Histologic grading system: 3 grade system Lymph-vascular invasion (LVI): LVI not present (absent)/not identified Residual tumor (R): R0 Paget's disease: Negative Tumor grade (Scarff-Bloom-Richardson system): G2 Estrogen receptor status: Positive Estrogen receptor test method: IHC Intensity of estrogen receptor staining: Strong Percentage of positive estrogen  receptors (%): 90 Progesterone receptor status: Positive Progesterone receptor test method: IHC Intensity of progesterone receptor staining: Weak Percentage of positive progesterone receptors (%): 1 HER2 status: Negative IHC of regional lymph nodes: Negative Results for molecular studies of regional lymph nodes: Negative Circulating tumor cells (CTC): Not assessed Disseminated tumor cells: Not assessed Multi-gene signature risk of recurrence: Intermediate risk Multi-gene signature score: 21   Evalene PARAS Jacobo, MD   06/29/2024 2:45 PM
